# Patient Record
Sex: Female | Born: 1965 | Race: White | Hispanic: No | Marital: Single | Smoking: Never smoker
Health system: Southern US, Community
[De-identification: ages and names within clinical notes are randomized; demographics above are authoritative.]

## PROBLEM LIST (undated history)

## (undated) DIAGNOSIS — K219 Gastro-esophageal reflux disease without esophagitis: Secondary | ICD-10-CM

## (undated) DIAGNOSIS — I471 Supraventricular tachycardia, unspecified: Secondary | ICD-10-CM

## (undated) DIAGNOSIS — E669 Obesity, unspecified: Secondary | ICD-10-CM

## (undated) DIAGNOSIS — M545 Low back pain, unspecified: Secondary | ICD-10-CM

## (undated) DIAGNOSIS — F329 Major depressive disorder, single episode, unspecified: Secondary | ICD-10-CM

## (undated) DIAGNOSIS — I10 Essential (primary) hypertension: Secondary | ICD-10-CM

## (undated) DIAGNOSIS — F32A Depression, unspecified: Secondary | ICD-10-CM

## (undated) DIAGNOSIS — M1990 Unspecified osteoarthritis, unspecified site: Secondary | ICD-10-CM

## (undated) DIAGNOSIS — G8929 Other chronic pain: Secondary | ICD-10-CM

## (undated) HISTORY — PX: HEMANGIOMA EXCISION: SHX1734

## (undated) HISTORY — PX: TOE SURGERY: SHX1073

---

## 1988-06-24 HISTORY — PX: REDUCTION MAMMAPLASTY: SUR839

## 2004-07-18 ENCOUNTER — Encounter: Admission: RE | Admit: 2004-07-18 | Discharge: 2004-07-18 | Payer: Self-pay | Admitting: Family Medicine

## 2010-07-15 ENCOUNTER — Encounter: Payer: Self-pay | Admitting: Family Medicine

## 2011-08-28 ENCOUNTER — Encounter (HOSPITAL_BASED_OUTPATIENT_CLINIC_OR_DEPARTMENT_OTHER): Payer: Self-pay | Admitting: Emergency Medicine

## 2011-08-28 ENCOUNTER — Emergency Department (HOSPITAL_BASED_OUTPATIENT_CLINIC_OR_DEPARTMENT_OTHER)
Admission: EM | Admit: 2011-08-28 | Discharge: 2011-08-28 | Disposition: A | Discharge status: Home Health | Payer: Self-pay | Attending: Emergency Medicine | Admitting: Emergency Medicine

## 2011-08-28 ENCOUNTER — Other Ambulatory Visit: Payer: Self-pay

## 2011-08-28 DIAGNOSIS — E669 Obesity, unspecified: Secondary | ICD-10-CM | POA: Insufficient documentation

## 2011-08-28 DIAGNOSIS — I498 Other specified cardiac arrhythmias: Secondary | ICD-10-CM | POA: Insufficient documentation

## 2011-08-28 DIAGNOSIS — R002 Palpitations: Secondary | ICD-10-CM | POA: Insufficient documentation

## 2011-08-28 DIAGNOSIS — I471 Supraventricular tachycardia: Secondary | ICD-10-CM

## 2011-08-28 HISTORY — DX: Major depressive disorder, single episode, unspecified: F32.9

## 2011-08-28 HISTORY — DX: Supraventricular tachycardia, unspecified: I47.10

## 2011-08-28 HISTORY — DX: Depression, unspecified: F32.A

## 2011-08-28 HISTORY — DX: Supraventricular tachycardia: I47.1

## 2011-08-28 HISTORY — DX: Obesity, unspecified: E66.9

## 2011-08-28 LAB — BASIC METABOLIC PANEL
CO2: 25 mEq/L (ref 19–32)
Calcium: 9.1 mg/dL (ref 8.4–10.5)
Creatinine, Ser: 0.8 mg/dL (ref 0.50–1.10)
GFR calc non Af Amer: 89 mL/min — ABNORMAL LOW (ref >90)

## 2011-08-28 LAB — MAGNESIUM: Magnesium: 2.3 mg/dL (ref 1.5–2.5)

## 2011-08-28 MED ORDER — VERAPAMIL HCL ER 120 MG PO TBCR: 120.0000 mg | EXTENDED_RELEASE_TABLET | Freq: Every day | ORAL | Status: AC

## 2011-08-28 MED ORDER — POTASSIUM CHLORIDE 20 MEQ/15ML (10%) PO LIQD
20.0000 meq | Freq: Once | ORAL | Status: AC
  Administered 2011-08-28: 20 meq via ORAL
  Filled 2011-08-28: qty 15

## 2011-08-28 MED ORDER — ADENOSINE 6 MG/2ML IV SOLN
INTRAVENOUS | Status: AC
  Filled 2011-08-28: qty 6

## 2011-08-28 MED ORDER — ADENOSINE 6 MG/2ML IV SOLN
6.0000 mg | Freq: Once | INTRAVENOUS | Status: AC
  Administered 2011-08-28: 6 mg via INTRAVENOUS

## 2011-08-28 NOTE — ED Notes (Signed)
Pt c/o rapid heart rate with headache and tightness in shoulders. Pt has hx of svt.

## 2011-08-28 NOTE — ED Notes (Signed)
Pt ambulatory to restroom without difficulty.

## 2011-08-28 NOTE — ED Provider Notes (Signed)
History     CSN: [id number]  Arrival date & time 08/28/11  2105   First MD Initiated Contact with Patient 08/28/11 2127      Chief Complaint  Patient presents with  . Tachycardia    (Consider location/radiation/quality/duration/timing/severity/associated sxs/prior treatment) HPI  H/o SVT pw patient. The patient states that about an hour prior to arrival she began to experience sudden onset palpitations. Similar to her previous SVT. She tried vagal maneuvers at home without success. The patient complains of tightness in her shoulders and her back similar to her previous SVT. She does not have a cardiologist at this time. She denies chest pain, shortness of breath. Denies h/o VTE in self or family. No recent hosp/surg/immob. No h/o cancer. Denies exogenous hormone use, no leg pain or swelling. sHe states she's been more stressed recently which sometimes aggravates her SVT. She has not been compliant with her verapamil.  ED Notes, ED Provider Notes from 08/28/11 0000 to 08/28/11 21:17:40       Caryl Asp Dulcy Fanny, RN 08/28/2011 21:15      Pt c/o rapid heart rate with headache and tightness in shoulders. Pt has hx of svt.       Past Medical History  Diagnosis Date  . SVT (supraventricular tachycardia)   . Depression   . Obesity   . Migraine     Past Surgical History  Procedure Date  . Angioma cautery   . Toe surgery     No family history on file.  History  Substance Use Topics  . Smoking status: Never Smoker   . Smokeless tobacco: Not on file  . Alcohol Use: No    OB History    Grav Para Term Preterm Abortions TAB SAB Ect Mult Living                  Review of Systems  All other systems reviewed and are negative.   except as noted HPI   Allergies  Penicillins  Home Medications   Current Outpatient Rx  Name Route Sig Dispense Refill  . VERAPAMIL HCL ER 120 MG PO TBCR Oral Take 1 tablet (120 mg total) by mouth at bedtime. 30 tablet 0    BP 111/64  Temp(Src)  98.3 F (36.8 C) (Oral)  Resp 18  SpO2 100%  Physical Exam  Nursing note and vitals reviewed. Constitutional: She is oriented to person, place, and time. She appears well-developed.  HENT:  Head: Atraumatic.  Mouth/Throat: Oropharynx is clear and moist.  Eyes: Conjunctivae and EOM are normal. Pupils are equal, round, and reactive to light.  Neck: Normal range of motion. Neck supple.  Cardiovascular: Regular rhythm, normal heart sounds and intact distal pulses.        tachycardic  Pulmonary/Chest: Effort normal and breath sounds normal. No respiratory distress. She has no wheezes. She has no rales.  Abdominal: Soft. She exhibits no distension. There is no tenderness. There is no rebound and no guarding.  Musculoskeletal: Normal range of motion.  Neurological: She is alert and oriented to person, place, and time.  Skin: Skin is warm and dry. No rash noted. There is pallor.  Psychiatric: She has a normal mood and affect.       Appears anxious    Date: 08/28/2011   2116  Rate: 170  Rhythm: supraventricular tachycardia (SVT)  QRS Axis: normal  Intervals: normal  ST/T Wave abnormalities: nonspecific ST changes  Conduction Disutrbances:none  Narrative Interpretation:   Old EKG Reviewed: none available  Date: 08/28/2011  2128  Rate: 97  Rhythm: normal sinus rhythm  QRS Axis: normal  Intervals: normal  ST/T Wave abnormalities: normal  Conduction Disutrbances:none  Narrative Interpretation:   Old EKG Reviewed: changes noted   ED Course  CARDIOVERSION, chemical Performed by: Forbes Cellar Authorized by: Forbes Cellar Consent: Verbal consent obtained. Written consent not obtained. The procedure was performed in an emergent situation. Risks and benefits: risks, benefits and alternatives were discussed Consent given by: patient Patient understanding: patient states understanding of the procedure being performed Patient consent: the patient's understanding of the procedure  matches consent given Procedure consent: procedure consent matches procedure scheduled Patient identity confirmed: arm band Time out: Immediately prior to procedure a "time out" was called to verify the correct patient, procedure, equipment, support staff and site/side marked as required. Patient sedated: no Cardioversion basis: emergent Pre-procedure rhythm: supraventricular tachycardia Patient position: patient was placed in a supine position Chest area: chest area exposed Electrodes: pads Electrodes placed: anterior-posterior Number of attempts: 1 Attempt 1 outcome: conversion to normal sinus rhythm Post-procedure rhythm: normal sinus rhythm Patient tolerance: Patient tolerated the procedure well with no immediate complications. Comments: Adenosine 6mg  given   (including critical care time)  Labs Reviewed  BASIC METABOLIC PANEL - Abnormal; Notable for the following:    Potassium 3.4 (*)    Glucose, Bld 111 (*)    GFR calc non Af Amer 89 (*)    All other components within normal limits  MAGNESIUM  PHOSPHORUS   No results found.   1. SVT (supraventricular tachycardia)     MDM  SVT with history of the same. Noncompliant with verapamil. Patient given 6 mg of adenosine with termination of the rhythm. She is currently in sinus rhythm and heart rate has been under 100. Electrolytes are within normal limits except for mildly decreased potassium. This has been supplemented. She will be discharged home with a cardiology referral. Precautions for return.         Forbes Cellar, MD 08/28/11 2226

## 2011-08-28 NOTE — Discharge Instructions (Signed)
Supraventricular Tachycardia  Supraventricular tachycardia (SVT) is an abnormal heart rhythm (arrhythmia) that causes the heart to beat very fast (tachycardia). This kind of fast heartbeat originates in the upper chambers of the heart (atria). SVT can cause the heart to beat greater than 100 beats per minute. SVT can have a rapid burst of heartbeats. This can start and stop suddenly without warning and is called nonsustained. SVT can also be sustained, in which the heart beats at a continuous fast rate.   CAUSES   There can be different causes of SVT. Some of these include:   Heart valve problems such as mitral valve prolapse.   An enlarged heart (hypertrophic cardiomyopathy).   Congenital heart problems.   Heart inflammation (pericarditis).   Hyperthyroidism.   Low potassium or magnesium levels.   Caffeine.   Drug use such as cocaine, methamphetamines, or stimulants.   Some over-the-counter medicines such as:   Decongestants.   Diet medicines.   Herbal medicines.  SYMPTOMS   Symptoms of SVT can vary. Symptoms depend on whether the SVT is sustained or nonsustained. You may experience:   No symptoms (asymptomatic).   An awareness of your heart beating rapidly (palpitations).   Shortness of breath.   Chest pain or pressure.  If your blood pressure drops because of the SVT, you may experience:   Fainting or near fainting.   Weakness.   Dizziness.  DIAGNOSIS   Different tests can be performed to diagnose SVT, such as:   An electrocardiogram (EKG). This is a painless test that records the electrical activity of your heart.   Holter monitor. This is a 24 hour recording of your heart rhythm. You will be given a diary. Write down all symptoms that you have and what you were doing at the time you experienced symptoms.   Arrhythmia monitor. This is a small device that your wear for several weeks. It records the heart rhythm when you have symptoms.   Echocardiogram. This is an imaging test to help detect  abnormal heart structure such as congenital abnormalities, heart valve problems, or heart enlargement.   Stress test. This test can help determine if the SVT is related to exercise.   Electrophysiology study (EPS). This is a procedure that evaluates your heart's electrical system and can help your caregiver find the cause of your SVT.  TREATMENT   Treatment of SVT depends on the symptoms, how often it recurs, and whether there are any underlying heart problems.    If symptoms are rare and no other cardiac disease is present, no treatment may be needed.   Blood work may be done to check potassium, magnesium, and thyroid hormone levels to see if they are abnormal. If these levels are abnormal, treatment to correct the problems will occur.  Medicines  Your caregiver may use oral medicines to treat SVT. These medicines are given for long-term control of SVT. Medicines may be used alone or in combination with other treatments. These medicines work to slow nerve impulses in the heart muscle. These medicines can also be used to treat high blood pressure. Some of these medicines may include:   Calcium channel blockers.   Beta blockers.   Digoxin.  Nonsurgical procedures  Nonsurgical techniques may be used if oral medicines do not work. Some examples include:   Cardioversion. This technique uses either drugs or an electrical shock to restore a normal heart rhythm.   Cardioversion drugs may be given through an intravenous (IV) line to help "reset" the   heart rhythm.   In electrical cardioversion, the caregiver shocks your heart to stop its beat for a split second. This helps to reset the heart to a normal rhythm.   Ablation. This procedure is done under mild sedation. High frequency radio wave energy is used to destroy the area of heart tissue responsible for the SVT.  HOME CARE INSTRUCTIONS    Do not smoke.   Only take medicines prescribed by your caregiver. Check with your caregiver before using over-the-counter  medicines.   Check with your caregiver about how much alcohol and caffeine (coffee, tea, colas, or chocolate) you may have.   It is very important to keep all follow-up referrals and appointments in order to properly manage this problem.  SEEK IMMEDIATE MEDICAL CARE IF:   You have dizziness.   You faint or nearly faint.   You have shortness of breath.   You have chest pain or pressure.   You have sudden nausea or vomiting.   You have profuse sweating.   You are concerned about how long your symptoms last.   You are concerned about the frequency of your SVT episodes.  If you have the above symptoms, call your local emergency services (911 in [place] immediately. Do not drive yourself to the hospital.  MAKE SURE YOU:    Understand these instructions.   Will watch your condition.   Will get help right away if you are not doing well or get worse.  Document Released: 06/10/2005 Document Revised: 05/30/2011 Document Reviewed: 09/22/2008  [employer] Patient Information 2012 [employer]

## 2012-10-31 ENCOUNTER — Emergency Department (HOSPITAL_BASED_OUTPATIENT_CLINIC_OR_DEPARTMENT_OTHER)
Admission: EM | Admit: 2012-10-31 | Discharge: 2012-10-31 | Disposition: A | Discharge status: Home / Self Care | Payer: Self-pay | Attending: Emergency Medicine | Admitting: Emergency Medicine

## 2012-10-31 ENCOUNTER — Encounter (HOSPITAL_BASED_OUTPATIENT_CLINIC_OR_DEPARTMENT_OTHER): Payer: Self-pay | Admitting: *Deleted

## 2012-10-31 DIAGNOSIS — Z8679 Personal history of other diseases of the circulatory system: Secondary | ICD-10-CM | POA: Insufficient documentation

## 2012-10-31 DIAGNOSIS — Z880 Allergy status to penicillin: Secondary | ICD-10-CM | POA: Insufficient documentation

## 2012-10-31 DIAGNOSIS — F3289 Other specified depressive episodes: Secondary | ICD-10-CM | POA: Insufficient documentation

## 2012-10-31 DIAGNOSIS — Z79899 Other long term (current) drug therapy: Secondary | ICD-10-CM | POA: Insufficient documentation

## 2012-10-31 DIAGNOSIS — F329 Major depressive disorder, single episode, unspecified: Secondary | ICD-10-CM | POA: Insufficient documentation

## 2012-10-31 DIAGNOSIS — G43909 Migraine, unspecified, not intractable, without status migrainosus: Secondary | ICD-10-CM | POA: Insufficient documentation

## 2012-10-31 DIAGNOSIS — E669 Obesity, unspecified: Secondary | ICD-10-CM | POA: Insufficient documentation

## 2012-10-31 MED ORDER — METOCLOPRAMIDE HCL 5 MG/ML IJ SOLN
10.0000 mg | Freq: Once | INTRAMUSCULAR | Status: AC
  Administered 2012-10-31: 10 mg via INTRAMUSCULAR
  Filled 2012-10-31: qty 2

## 2012-10-31 MED ORDER — ZOLMITRIPTAN 5 MG PO TABS: 5.0000 mg | ORAL_TABLET | ORAL | Status: DC | PRN

## 2012-10-31 MED ORDER — KETOROLAC TROMETHAMINE 60 MG/2ML IM SOLN
60.0000 mg | Freq: Once | INTRAMUSCULAR | Status: AC
  Administered 2012-10-31: 60 mg via INTRAMUSCULAR
  Filled 2012-10-31: qty 2

## 2012-10-31 MED ORDER — DIPHENHYDRAMINE HCL 50 MG/ML IJ SOLN
25.0000 mg | Freq: Once | INTRAMUSCULAR | Status: AC
  Administered 2012-10-31: 25 mg via INTRAMUSCULAR
  Filled 2012-10-31: qty 1

## 2012-10-31 NOTE — ED Notes (Signed)
Pt has hx of migraines and has run out of her meds. Normally takes Zomig, but can no longer get Rx for same because she does not have a Dr.  Marland KitchenNo insurance."

## 2012-10-31 NOTE — ED Notes (Signed)
Rx x 1 for zomig given- pt has a ride

## 2012-10-31 NOTE — ED Provider Notes (Signed)
History     CSN: [id number]  Arrival date & time 10/31/12  1524   First MD Initiated Contact with Patient 10/31/12 1620      Chief Complaint  Patient presents with  . Migraine    (Consider location/radiation/quality/duration/timing/severity/associated sxs/prior treatment) Patient is a 47 y.o. female presenting with migraines. The history is provided by the patient. No language interpreter was used.  Migraine This is a new problem. The current episode started yesterday. The problem occurs constantly. The problem has been gradually worsening. Nothing aggravates the symptoms. She has tried nothing for the symptoms. The treatment provided moderate relief.  Pt complains of a migrane headache.    Past Medical History  Diagnosis Date  . SVT (supraventricular tachycardia)   . Depression   . Obesity   . Migraine     Past Surgical History  Procedure Laterality Date  . Angioma cautery    . Toe surgery      No family history on file.  History  Substance Use Topics  . Smoking status: Never Smoker   . Smokeless tobacco: Not on file  . Alcohol Use: No    OB History   Grav Para Term Preterm Abortions TAB SAB Ect Mult Living                  Review of Systems  All other systems reviewed and are negative.    Allergies  Penicillins  Home Medications   Current Outpatient Rx  Name  Route  Sig  Dispense  Refill  . cloNIDine (CATAPRES) 0.1 MG tablet   Oral   Take 0.1 mg by mouth at bedtime.         Marland Kitchen acetaminophen (TYLENOL) 500 MG tablet   Oral   Take 1,000 mg by mouth every 6 (six) hours as needed. Patient used this medication for pain.         . DULoxetine (CYMBALTA) 60 MG capsule   Oral   Take 60 mg by mouth daily.         . fluticasone (FLONASE) 50 MCG/ACT nasal spray   Nasal   Place 2 sprays into the nose daily.         Marland Kitchen ibuprofen (ADVIL,MOTRIN) 200 MG tablet   Oral   Take 600 mg by mouth every 6 (six) hours as needed. Patient used this medication  for pain.         Marland Kitchen lamoTRIgine (LAMICTAL) 200 MG tablet   Oral   Take 200 mg by mouth daily.         Marland Kitchen trolamine salicylate (ASPERCREME) 10 % cream   Topical   Apply topically as needed. Patient used this medication for foot and shoulder pain.         Marland Kitchen zolmitriptan (ZOMIG) 5 MG tablet   Oral   Take 5 mg by mouth as needed. Patient took 2.5 mg of this medication today.           BP 138/81  Pulse 63  Temp(Src) 97.8 F (36.6 C) (Oral)  Resp 18  Ht 5\' 5"  (1.651 m)  Wt 250 lb (113.399 kg)  BMI 41.6 kg/m2  SpO2 100%  LMP 10/31/2012  Physical Exam  Nursing note and vitals reviewed. Constitutional: She appears well-developed and well-nourished.  HENT:  Head: Normocephalic and atraumatic.  Right Ear: External ear normal.  Left Ear: External ear normal.  Nose: Nose normal.  Mouth/Throat: Oropharynx is clear and moist.  Eyes: Pupils are equal, round, and reactive to  light.  Neck: Normal range of motion. Neck supple.  Cardiovascular: Normal rate.   Pulmonary/Chest: Effort normal.  Abdominal: Soft.  Musculoskeletal: Normal range of motion.  Neurological: She is alert.  Skin: Skin is warm.    ED Course  Procedures (including critical care time)  Labs Reviewed - No data to display No results found.   No diagnosis found.    MDM  Pt is given reglan and benadryl and torodol.          Elson Areas, PA-C 10/31/12 1724  Medical screening examination/treatment/procedure(s) were performed by non-physician practitioner and as supervising physician I was immediately available for consultation/collaboration.  Derwood Kaplan, MD 11/03/12 [phone]

## 2013-08-29 ENCOUNTER — Other Ambulatory Visit: Payer: Self-pay

## 2013-08-29 ENCOUNTER — Emergency Department (HOSPITAL_BASED_OUTPATIENT_CLINIC_OR_DEPARTMENT_OTHER): Payer: BC Managed Care – PPO

## 2013-08-29 ENCOUNTER — Encounter (HOSPITAL_BASED_OUTPATIENT_CLINIC_OR_DEPARTMENT_OTHER): Payer: Self-pay | Admitting: Emergency Medicine

## 2013-08-29 ENCOUNTER — Emergency Department (HOSPITAL_BASED_OUTPATIENT_CLINIC_OR_DEPARTMENT_OTHER)
Admission: EM | Admit: 2013-08-29 | Discharge: 2013-08-30 | Disposition: A | Discharge status: Home / Self Care | Payer: BC Managed Care – PPO | Attending: Emergency Medicine | Admitting: Emergency Medicine

## 2013-08-29 DIAGNOSIS — R0602 Shortness of breath: Secondary | ICD-10-CM | POA: Insufficient documentation

## 2013-08-29 DIAGNOSIS — I471 Supraventricular tachycardia: Secondary | ICD-10-CM

## 2013-08-29 DIAGNOSIS — E669 Obesity, unspecified: Secondary | ICD-10-CM | POA: Insufficient documentation

## 2013-08-29 DIAGNOSIS — F3289 Other specified depressive episodes: Secondary | ICD-10-CM | POA: Insufficient documentation

## 2013-08-29 DIAGNOSIS — Z79899 Other long term (current) drug therapy: Secondary | ICD-10-CM | POA: Insufficient documentation

## 2013-08-29 DIAGNOSIS — Z880 Allergy status to penicillin: Secondary | ICD-10-CM | POA: Insufficient documentation

## 2013-08-29 DIAGNOSIS — F329 Major depressive disorder, single episode, unspecified: Secondary | ICD-10-CM | POA: Insufficient documentation

## 2013-08-29 DIAGNOSIS — IMO0002 Reserved for concepts with insufficient information to code with codable children: Secondary | ICD-10-CM | POA: Insufficient documentation

## 2013-08-29 DIAGNOSIS — G43909 Migraine, unspecified, not intractable, without status migrainosus: Secondary | ICD-10-CM | POA: Insufficient documentation

## 2013-08-29 DIAGNOSIS — I498 Other specified cardiac arrhythmias: Secondary | ICD-10-CM | POA: Insufficient documentation

## 2013-08-29 LAB — BASIC METABOLIC PANEL
BUN: 12 mg/dL (ref 6–23)
CALCIUM: 9.5 mg/dL (ref 8.4–10.5)
CHLORIDE: 102 meq/L (ref 96–112)
CO2: 23 meq/L (ref 19–32)
Creatinine, Ser: 0.7 mg/dL (ref 0.50–1.10)
GFR calc Af Amer: >90 mL/min (ref >90)
GFR calc non Af Amer: >90 mL/min (ref >90)
GLUCOSE: 143 mg/dL — AB (ref 70–99)
POTASSIUM: 4.2 meq/L (ref 3.7–5.3)
SODIUM: 141 meq/L (ref 137–147)

## 2013-08-29 LAB — CBC WITH DIFFERENTIAL/PLATELET
BASOS ABS: 0.1 10*3/uL (ref 0.0–0.1)
Basophils Relative: 1 % (ref 0–1)
EOS PCT: 9 % — AB (ref 0–5)
Eosinophils Absolute: 0.9 10*3/uL — ABNORMAL HIGH (ref 0.0–0.7)
HEMATOCRIT: 43.5 % (ref 36.0–46.0)
Hemoglobin: 14.7 g/dL (ref 12.0–15.0)
LYMPHS ABS: 2.6 10*3/uL (ref 0.7–4.0)
LYMPHS PCT: 24 % (ref 12–46)
MCH: 30.5 pg (ref 26.0–34.0)
MCHC: 33.8 g/dL (ref 30.0–36.0)
MCV: 90.2 fL (ref 78.0–100.0)
Monocytes Absolute: 0.6 10*3/uL (ref 0.1–1.0)
Monocytes Relative: 6 % (ref 3–12)
NEUTROS ABS: 6.6 10*3/uL (ref 1.7–7.7)
Neutrophils Relative %: 61 % (ref 43–77)
PLATELETS: 385 10*3/uL (ref 150–400)
RBC: 4.82 MIL/uL (ref 3.87–5.11)
RDW: 13.8 % (ref 11.5–15.5)
WBC: 10.8 10*3/uL — AB (ref 4.0–10.5)

## 2013-08-29 MED ORDER — SODIUM CHLORIDE 0.9 % IV SOLN
INTRAVENOUS | Status: DC
  Administered 2013-08-29: 23:00:00 via INTRAVENOUS

## 2013-08-29 MED ORDER — ADENOSINE 6 MG/2ML IV SOLN
INTRAVENOUS | Status: AC
Start: 2013-08-29 — End: 2013-08-29
  Administered 2013-08-29: 6 mg via INTRAVENOUS
  Filled 2013-08-29: qty 2

## 2013-08-29 NOTE — ED Notes (Signed)
Patient has had a rapid HR for the past hour. 194 in triage. Has had this in the past and been treated for it multiple times

## 2013-08-29 NOTE — ED Provider Notes (Addendum)
CSN: [id number]     Arrival date & time 08/29/13  2235 History   This chart was scribed for Jennifer Jakes, MD by Manuela Schwartz, ED scribe. This patient was seen in room MH07/MH07 and the patient's care was started at 2235.  Chief Complaint  Patient presents with  . Tachycardia   Patient is a 48 y.o. female presenting with palpitations. The history is provided by the patient. No language interpreter was used.  Palpitations Palpitations quality:  Fast Onset quality:  Sudden Duration:  1 hour Timing:  Constant Progression:  Unchanged Chronicity:  Recurrent Relieved by:  Nothing Worsened by:  Nothing tried Ineffective treatments:  Valsalva Associated symptoms: chest pain and shortness of breath   Associated symptoms: no back pain, no cough, no nausea and no vomiting    HPI Comments: Jennifer Howard is a 48 y.o. female who presents currently in SVT to the Emergency Department with h/o recurrent SVT last episode a few weeks ago, sometimes gets relief w/vagal maneuvers at home which did not work today, onset x1 hour PTA. She usually converts well w/adenosine when this happens. She reports mild SOB, nausea and chest tightness/discomfort. She denies any skin rash, visual changes, dysuria, back pain, hemophilia, cough/cold, sore throat or fever/chills.   No hx of dm, htn.  Past Medical History  Diagnosis Date  . SVT (supraventricular tachycardia)   . Depression   . Obesity   . Migraine    Past Surgical History  Procedure Laterality Date  . Angioma cautery    . Toe surgery     No family history on file. History  Substance Use Topics  . Smoking status: Never Smoker   . Smokeless tobacco: Not on file  . Alcohol Use: No   OB History   Grav Para Term Preterm Abortions TAB SAB Ect Mult Living                 Review of Systems  Constitutional: Negative for chills and fatigue.  HENT: Negative for rhinorrhea and sore throat.   Eyes: Negative for visual disturbance.  Respiratory:  Positive for shortness of breath. Negative for cough.   Cardiovascular: Positive for chest pain and palpitations. Negative for leg swelling.  Gastrointestinal: Negative for nausea, vomiting, abdominal pain and diarrhea.  Musculoskeletal: Negative for back pain.  Skin: Negative for rash.  Hematological: Does not bruise/bleed easily.  Psychiatric/Behavioral: Negative for confusion.  All other systems reviewed and are negative.    Allergies  Iodine and Penicillins  Home Medications   Current Outpatient Rx  Name  Route  Sig  Dispense  Refill  . acetaminophen (TYLENOL) 500 MG tablet   Oral   Take 1,000 mg by mouth every 6 (six) hours as needed. Patient used this medication for pain.         . cloNIDine (CATAPRES) 0.1 MG tablet   Oral   Take 0.1 mg by mouth at bedtime.         . DULoxetine (CYMBALTA) 60 MG capsule   Oral   Take 60 mg by mouth daily.         . fluticasone (FLONASE) 50 MCG/ACT nasal spray   Nasal   Place 2 sprays into the nose daily.         Marland Kitchen ibuprofen (ADVIL,MOTRIN) 200 MG tablet   Oral   Take 600 mg by mouth every 6 (six) hours as needed. Patient used this medication for pain.         Marland Kitchen lamoTRIgine (  LAMICTAL) 200 MG tablet   Oral   Take 200 mg by mouth daily.         Marland Kitchen trolamine salicylate (ASPERCREME) 10 % cream   Topical   Apply topically as needed. Patient used this medication for foot and shoulder pain.         Marland Kitchen zolmitriptan (ZOMIG) 5 MG tablet   Oral   Take 5 mg by mouth as needed. Patient took 2.5 mg of this medication today.         . zolmitriptan (ZOMIG) 5 MG tablet   Oral   Take 1 tablet (5 mg total) by mouth as needed for migraine.   10 tablet   0    Pulse 186  SpO2 100%  Physical Exam  Nursing note and vitals reviewed. Constitutional: She is oriented to person, place, and time. She appears well-developed and well-nourished. No distress.  HENT:  Head: Normocephalic and atraumatic.  Eyes: Conjunctivae are normal.  Right eye exhibits no discharge. Left eye exhibits no discharge.  Neck: Normal range of motion.  Cardiovascular: Normal heart sounds.   No murmur heard. tachycardic  Pulmonary/Chest: Effort normal and breath sounds normal. No respiratory distress.  Abdominal: Soft. Bowel sounds are normal. She exhibits no distension. There is no tenderness.  Musculoskeletal: Normal range of motion. She exhibits no edema.  No ankle swelling  Neurological: She is alert and oriented to person, place, and time.  Neuro grossly intact  Skin: Skin is warm and dry.  Psychiatric: She has a normal mood and affect. Thought content normal.   ED Course  Procedures (including critical care time) DIAGNOSTIC STUDIES: Oxygen Saturation is 100% on room air, normal by my interpretation.    COORDINATION OF CARE: At 1107 Discussed treatment plan with patient which includes peripheral IV adenosine . Patient agrees.   Labs Review Labs Reviewed  CBC WITH DIFFERENTIAL - Abnormal; Notable for the following:    WBC 10.8 (*)    Eosinophils Relative 9 (*)    Eosinophils Absolute 0.9 (*)    All other components within normal limits  BASIC METABOLIC PANEL   Results for orders placed during the hospital encounter of 08/29/13  CBC WITH DIFFERENTIAL      Result Value Ref Range   WBC 10.8 (*) 4.0 - 10.5 K/uL   RBC 4.82  3.87 - 5.11 MIL/uL   Hemoglobin 14.7  12.0 - 15.0 g/dL   HCT 16.1  09.6 - 04.5 %   MCV 90.2  78.0 - 100.0 fL   MCH 30.5  26.0 - 34.0 pg   MCHC 33.8  30.0 - 36.0 g/dL   RDW 40.9  81.1 - 91.4 %   Platelets 385  150 - 400 K/uL   Neutrophils Relative % 61  43 - 77 %   Neutro Abs 6.6  1.7 - 7.7 K/uL   Lymphocytes Relative 24  12 - 46 %   Lymphs Abs 2.6  0.7 - 4.0 K/uL   Monocytes Relative 6  3 - 12 %   Monocytes Absolute 0.6  0.1 - 1.0 K/uL   Eosinophils Relative 9 (*) 0 - 5 %   Eosinophils Absolute 0.9 (*) 0.0 - 0.7 K/uL   Basophils Relative 1  0 - 1 %   Basophils Absolute 0.1  0.0 - 0.1 K/uL  BASIC  METABOLIC PANEL      Result Value Ref Range   Sodium 141  137 - 147 mEq/L   Potassium 4.2  3.7 - 5.3 mEq/L  Chloride 102  96 - 112 mEq/L   CO2 23  19 - 32 mEq/L   Glucose, Bld 143 (*) 70 - 99 mg/dL   BUN 12  6 - 23 mg/dL   Creatinine, Ser 1.610.70  0.50 - 1.10 mg/dL   Calcium 9.5  8.4 - 09.610.5 mg/dL   GFR calc non Af Amer >90  >90 mL/min   GFR calc Af Amer >90  >90 mL/min    Imaging Review   Results for orders placed during the hospital encounter of 08/29/13  CBC WITH DIFFERENTIAL      Result Value Ref Range   WBC 10.8 (*) 4.0 - 10.5 K/uL   RBC 4.82  3.87 - 5.11 MIL/uL   Hemoglobin 14.7  12.0 - 15.0 g/dL   HCT 04.543.5  40.936.0 - 81.146.0 %   MCV 90.2  78.0 - 100.0 fL   MCH 30.5  26.0 - 34.0 pg   MCHC 33.8  30.0 - 36.0 g/dL   RDW 91.413.8  78.211.5 - 95.615.5 %   Platelets 385  150 - 400 K/uL   Neutrophils Relative % 61  43 - 77 %   Neutro Abs 6.6  1.7 - 7.7 K/uL   Lymphocytes Relative 24  12 - 46 %   Lymphs Abs 2.6  0.7 - 4.0 K/uL   Monocytes Relative 6  3 - 12 %   Monocytes Absolute 0.6  0.1 - 1.0 K/uL   Eosinophils Relative 9 (*) 0 - 5 %   Eosinophils Absolute 0.9 (*) 0.0 - 0.7 K/uL   Basophils Relative 1  0 - 1 %   Basophils Absolute 0.1  0.0 - 0.1 K/uL  BASIC METABOLIC PANEL      Result Value Ref Range   Sodium 141  137 - 147 mEq/L   Potassium 4.2  3.7 - 5.3 mEq/L   Chloride 102  96 - 112 mEq/L   CO2 23  19 - 32 mEq/L   Glucose, Bld 143 (*) 70 - 99 mg/dL   BUN 12  6 - 23 mg/dL   Creatinine, Ser 2.130.70  0.50 - 1.10 mg/dL   Calcium 9.5  8.4 - 08.610.5 mg/dL   GFR calc non Af Amer >90  >90 mL/min   GFR calc Af Amer >90  >90 mL/min   Dg Chest Port 1 View  08/29/2013   CLINICAL DATA:  SVTs, mild shortness of breath.  EXAM: PORTABLE CHEST - 1 VIEW  COMPARISON:  None.  FINDINGS: Limited evaluation due to technique/patient body habitus. Cardiomediastinal contours within normal range. No confluent airspace opacity, pleural effusion, or pneumothorax. No definite acute osseous finding.  IMPRESSION:  Within limitations of technique, no acute process identified.   Electronically Signed   By: Jearld LeschAndrew  DelGaizo M.D.   On: 08/29/2013 23:36     No results found.   EKG Interpretation None      Date: 08/29/2013  Rate: 180  Rhythm: supraventricular tachycardia (SVT)  QRS Axis: normal  Intervals: normal  ST/T Wave abnormalities: nonspecific ST changes  Conduction Disutrbances:none  Narrative Interpretation:   Old EKG Reviewed: none available  Status post adenosine 6 mg IV push patient converted second EKG as follows   Date: 08/29/2013  Rate: 96  Rhythm: normal sinus rhythm  QRS Axis: normal  Intervals: normal  ST/T Wave abnormalities: normal  Conduction Disutrbances:none  Narrative Interpretation:   Old EKG Reviewed: changes noted  CRITICAL CARE Performed by: Jennifer JakesZACKOWSKI,Krystalynn Ridgeway W. Total critical care time: 30 Critical care time was  exclusive of separately billable procedures and treating other patients. Critical care was necessary to treat or prevent imminent or life-threatening deterioration. Critical care was time spent personally by me on the following activities: development of treatment plan with patient and/or surrogate as well as nursing, discussions with consultants, evaluation of patient's response to treatment, examination of patient, obtaining history from patient or surrogate, ordering and performing treatments and interventions, ordering and review of laboratory studies, ordering and review of radiographic studies, pulse oximetry and re-evaluation of patient's condition.     MDM   Final diagnoses:  SVT (supraventricular tachycardia)    Patient new to the area does not have a local cardiologist. Just got a primary care doctor recently at [facility]. Patient with known history of SVT. Last episode was 2 weeks ago happens quite frequently patient normally able to converted with Valsalva maneuvers on her own. This time it did not convert. Patient arrived here they'll  status was normal rapid heart rate of 180 consistent with SVT minor got chest discomfort. Patient received IV 6 mg of adenosine with conversion. Heart rate back to normal sinus rhythm. Patient's basic labs and chest x-ray pending if normal can be discharged home. Will give referral to [facility] however patient will probably prefer to use cortisone she will contact her primary care Dr. for referral. Patient has been recommended to have ablation in the past but has refused.    I personally performed the services described in this documentation, which was scribed in my presence. The recorded information has been reviewed and is accurate.      Jennifer Jakes, MD 08/29/13 1610  Jennifer Jakes, MD 08/29/13 [phone]

## 2013-08-29 NOTE — Discharge Instructions (Signed)
Followup with cardiology. Return for any recurrent SVTs. Given given referral to [facility] but if you want to contact her new primary care Dr. at [facility] and [place] that fine.

## 2013-08-30 NOTE — ED Notes (Signed)
Ice pack applied to right hand d/t edema pt denies pain until touched right hand elevated

## 2013-09-26 ENCOUNTER — Emergency Department (HOSPITAL_BASED_OUTPATIENT_CLINIC_OR_DEPARTMENT_OTHER)
Admission: EM | Admit: 2013-09-26 | Discharge: 2013-09-26 | Disposition: A | Discharge status: Home / Self Care | Payer: BC Managed Care – PPO | Attending: Emergency Medicine | Admitting: Emergency Medicine

## 2013-09-26 ENCOUNTER — Encounter (HOSPITAL_BASED_OUTPATIENT_CLINIC_OR_DEPARTMENT_OTHER): Payer: Self-pay | Admitting: Emergency Medicine

## 2013-09-26 DIAGNOSIS — E669 Obesity, unspecified: Secondary | ICD-10-CM | POA: Insufficient documentation

## 2013-09-26 DIAGNOSIS — IMO0002 Reserved for concepts with insufficient information to code with codable children: Secondary | ICD-10-CM | POA: Insufficient documentation

## 2013-09-26 DIAGNOSIS — R61 Generalized hyperhidrosis: Secondary | ICD-10-CM | POA: Insufficient documentation

## 2013-09-26 DIAGNOSIS — F329 Major depressive disorder, single episode, unspecified: Secondary | ICD-10-CM | POA: Insufficient documentation

## 2013-09-26 DIAGNOSIS — F3289 Other specified depressive episodes: Secondary | ICD-10-CM | POA: Insufficient documentation

## 2013-09-26 DIAGNOSIS — Z79899 Other long term (current) drug therapy: Secondary | ICD-10-CM | POA: Insufficient documentation

## 2013-09-26 DIAGNOSIS — Z880 Allergy status to penicillin: Secondary | ICD-10-CM | POA: Insufficient documentation

## 2013-09-26 DIAGNOSIS — R5383 Other fatigue: Secondary | ICD-10-CM

## 2013-09-26 DIAGNOSIS — R5381 Other malaise: Secondary | ICD-10-CM | POA: Insufficient documentation

## 2013-09-26 DIAGNOSIS — H538 Other visual disturbances: Secondary | ICD-10-CM | POA: Insufficient documentation

## 2013-09-26 DIAGNOSIS — R51 Headache: Secondary | ICD-10-CM | POA: Insufficient documentation

## 2013-09-26 DIAGNOSIS — F411 Generalized anxiety disorder: Secondary | ICD-10-CM | POA: Insufficient documentation

## 2013-09-26 DIAGNOSIS — I471 Supraventricular tachycardia: Secondary | ICD-10-CM

## 2013-09-26 DIAGNOSIS — I498 Other specified cardiac arrhythmias: Secondary | ICD-10-CM | POA: Insufficient documentation

## 2013-09-26 DIAGNOSIS — G43909 Migraine, unspecified, not intractable, without status migrainosus: Secondary | ICD-10-CM | POA: Insufficient documentation

## 2013-09-26 LAB — CBC WITH DIFFERENTIAL/PLATELET
BASOS ABS: 0.1 10*3/uL (ref 0.0–0.1)
Basophils Relative: 1 % (ref 0–1)
EOS ABS: 1.2 10*3/uL — AB (ref 0.0–0.7)
Eosinophils Relative: 10 % — ABNORMAL HIGH (ref 0–5)
HCT: 37 % (ref 36.0–46.0)
Hemoglobin: 12.4 g/dL (ref 12.0–15.0)
Lymphocytes Relative: 24 % (ref 12–46)
Lymphs Abs: 2.9 10*3/uL (ref 0.7–4.0)
MCH: 30.5 pg (ref 26.0–34.0)
MCHC: 33.5 g/dL (ref 30.0–36.0)
MCV: 91.1 fL (ref 78.0–100.0)
Monocytes Absolute: 0.5 10*3/uL (ref 0.1–1.0)
Monocytes Relative: 4 % (ref 3–12)
NEUTROS PCT: 61 % (ref 43–77)
Neutro Abs: 7.3 10*3/uL (ref 1.7–7.7)
PLATELETS: 441 10*3/uL — AB (ref 150–400)
RBC: 4.06 MIL/uL (ref 3.87–5.11)
RDW: 14 % (ref 11.5–15.5)
WBC: 11.9 10*3/uL — ABNORMAL HIGH (ref 4.0–10.5)

## 2013-09-26 LAB — BASIC METABOLIC PANEL
BUN: 8 mg/dL (ref 6–23)
CO2: 24 mEq/L (ref 19–32)
Calcium: 9.1 mg/dL (ref 8.4–10.5)
Chloride: 103 mEq/L (ref 96–112)
Creatinine, Ser: 0.9 mg/dL (ref 0.50–1.10)
GFR calc Af Amer: 88 mL/min — ABNORMAL LOW (ref >90)
GFR, EST NON AFRICAN AMERICAN: 76 mL/min — AB (ref >90)
Glucose, Bld: 137 mg/dL — ABNORMAL HIGH (ref 70–99)
Potassium: 3.8 mEq/L (ref 3.7–5.3)
SODIUM: 141 meq/L (ref 137–147)

## 2013-09-26 MED ORDER — ADENOSINE 6 MG/2ML IV SOLN
6.0000 mg | Freq: Once | INTRAVENOUS | Status: AC
  Administered 2013-09-26: 6 mg via INTRAVENOUS

## 2013-09-26 MED ORDER — ADENOSINE 6 MG/2ML IV SOLN
INTRAVENOUS | Status: AC
  Administered 2013-09-26: 6 mg via INTRAVENOUS
  Filled 2013-09-26: qty 2

## 2013-09-26 MED ORDER — METOPROLOL TARTRATE 50 MG PO TABS: 50.0000 mg | ORAL_TABLET | ORAL | Status: DC | PRN

## 2013-09-26 NOTE — ED Provider Notes (Addendum)
CSN: [id number]     Arrival date & time 09/26/13  7829 History   First MD Initiated Contact with Patient 09/26/13 1008     Chief Complaint  Patient presents with  . Tachycardia     (Consider location/radiation/quality/duration/timing/severity/associated sxs/prior Treatment) Patient is a 48 y.o. female presenting with palpitations. The history is provided by the patient.  Palpitations Palpitations quality:  Fast Onset quality:  Sudden Duration:  1 hour Timing:  Constant Progression:  Unchanged Chronicity:  Recurrent Context: not bronchodilators, not caffeine, not hyperventilation, not illicit drugs, not nicotine and not stimulant use   Context comment:  States recently had a very heavy menses but no other change Relieved by:  Nothing Worsened by:  Nothing tried Ineffective treatments:  Deep relaxation, valsalva and breathing exercises Associated symptoms: diaphoresis and weakness   Associated symptoms: no back pain, no chest pain, no chest pressure, no cough, no shortness of breath, no syncope and no vomiting   Associated symptoms comment:  Headache and blurred vision Risk factors comment:  Hx of SVT   Past Medical History  Diagnosis Date  . SVT (supraventricular tachycardia)   . Depression   . Obesity   . Migraine    Past Surgical History  Procedure Laterality Date  . Angioma cautery    . Toe surgery     No family history on file. History  Substance Use Topics  . Smoking status: Never Smoker   . Smokeless tobacco: Not on file  . Alcohol Use: No   OB History   Grav Para Term Preterm Abortions TAB SAB Ect Mult Living                 Review of Systems  Constitutional: Positive for diaphoresis.  Respiratory: Negative for cough and shortness of breath.   Cardiovascular: Positive for palpitations. Negative for chest pain and syncope.  Gastrointestinal: Negative for vomiting.  Musculoskeletal: Negative for back pain.  All other systems reviewed and are  negative.      Allergies  Iodine and Penicillins  Home Medications   Current Outpatient Rx  Name  Route  Sig  Dispense  Refill  . acetaminophen (TYLENOL) 500 MG tablet   Oral   Take 1,000 mg by mouth every 6 (six) hours as needed. Patient used this medication for pain.         . cloNIDine (CATAPRES) 0.1 MG tablet   Oral   Take 0.1 mg by mouth at bedtime.         . DULoxetine (CYMBALTA) 60 MG capsule   Oral   Take 60 mg by mouth daily.         . fluticasone (FLONASE) 50 MCG/ACT nasal spray   Nasal   Place 2 sprays into the nose daily.         Marland Kitchen ibuprofen (ADVIL,MOTRIN) 200 MG tablet   Oral   Take 600 mg by mouth every 6 (six) hours as needed. Patient used this medication for pain.         Marland Kitchen lamoTRIgine (LAMICTAL) 200 MG tablet   Oral   Take 200 mg by mouth daily.         Marland Kitchen trolamine salicylate (ASPERCREME) 10 % cream   Topical   Apply topically as needed. Patient used this medication for foot and shoulder pain.         Marland Kitchen zolmitriptan (ZOMIG) 5 MG tablet   Oral   Take 5 mg by mouth as needed. Patient took 2.5 mg of this  medication today.         . zolmitriptan (ZOMIG) 5 MG tablet   Oral   Take 1 tablet (5 mg total) by mouth as needed for migraine.   10 tablet   0    BP 127/86  Pulse 92  Temp(Src) 98.7 F (37.1 C) (Oral)  Resp 18  SpO2 100% Physical Exam  Nursing note and vitals reviewed. Constitutional: She is oriented to person, place, and time. She appears well-developed and well-nourished. She appears distressed.  Pt appears anxious  HENT:  Head: Normocephalic and atraumatic.  Eyes: EOM are normal. Pupils are equal, round, and reactive to light.  Cardiovascular: Regular rhythm, normal heart sounds and intact distal pulses.  Tachycardia present.  Exam reveals no friction rub.   No murmur heard. Pulmonary/Chest: Effort normal and breath sounds normal. She has no wheezes. She has no rales.  Abdominal: Soft. Bowel sounds are normal. She  exhibits no distension. There is no tenderness. There is no rebound and no guarding.  Musculoskeletal: Normal range of motion. She exhibits no tenderness.  No edema  Neurological: She is alert and oriented to person, place, and time. No cranial nerve deficit.  Skin: Skin is warm. No rash noted. She is diaphoretic.  Psychiatric: She has a normal mood and affect. Her behavior is normal.    ED Course  Procedures (including critical care time) Labs Review Labs Reviewed  CBC WITH DIFFERENTIAL - Abnormal; Notable for the following:    WBC 11.9 (*)    Platelets 441 (*)    Eosinophils Relative 10 (*)    Eosinophils Absolute 1.2 (*)    All other components within normal limits  BASIC METABOLIC PANEL - Abnormal; Notable for the following:    Glucose, Bld 137 (*)    GFR calc non Af Amer 76 (*)    GFR calc Af Amer 88 (*)    All other components within normal limits   Imaging Review No results found.   EKG Interpretation   Date/Time:  Sunday September 26 2013 10:30:30 EDT Ventricular Rate:  93 PR Interval:  160 QRS Duration: 82 QT Interval:  366 QTC Calculation: 455 R Axis:   51 Text Interpretation:  Normal sinus rhythm Nonspecific ST abnormality  Supraventricular tachycardia RESOLVED SINCE PREVIOUS Confirmed by Anitra LauthPLUNKETT   MD, Alphonzo LemmingsWHITNEY ([id number]) on 09/26/2013 10:56:08 AM      MDM   Final diagnoses:  SVT (supraventricular tachycardia)    Patient with a history of long-standing SVT who takes no prophylactic therapy at this time presents today with onset of palpitations this started about one hour ago then it not resolved with Valsalva, carotid massage or other techniques attempted at home. Patient is complaining of having a headache, mild blurry vision and just feeling generally not well which started about 10 minutes after the palpitations. She denies any chest pain or shortness of breath. She is hemodynamically stable an EKG is consistent with SVT similar to prior EKGs. After 6 mg of  adenosine patient returned to sinus rhythm a repeat EKG shows sinus rhythm with mild ST segment depression. Patient recently had a very heavy menses with the 2 g drop in her hemoglobin from 14-12 but otherwise labs are unchanged. On reevaluation the patient feels back to baseline and much better. She had been on beta blockers in the past but states they make her feel tired. Patient given a prescription for beta blocker to use as abortive therapy when symptoms start and was referred to cardiology.  Pt  chemically cardioverted with 6mg  of adenosine.  Zol pads placed and pt placed on o2 with IV line present.  After 6mg  of adenosine pt converted to sinus rhythm.  Gwyneth Sprout, MD 09/26/13 1108  Gwyneth Sprout, MD 09/26/13 1123

## 2013-09-26 NOTE — ED Notes (Signed)
Patient not experiencing any SOB, no chest pressure, or headache at discharge

## 2013-09-26 NOTE — ED Notes (Signed)
Patient states that she felt her heart racing approx 45 minutes ago

## 2013-09-26 NOTE — ED Notes (Signed)
Patient placed on cardiac monitor, Zoll  Pads applied to L side front and back & crash cart at bedside prior to adenosine administration, patient placed on 2l oxygen, MD at bedside

## 2013-12-03 ENCOUNTER — Ambulatory Visit: Payer: BC Managed Care – PPO | Admitting: Cardiovascular Disease

## 2014-02-16 ENCOUNTER — Encounter (HOSPITAL_BASED_OUTPATIENT_CLINIC_OR_DEPARTMENT_OTHER): Payer: Self-pay | Admitting: Emergency Medicine

## 2014-02-16 ENCOUNTER — Emergency Department (HOSPITAL_BASED_OUTPATIENT_CLINIC_OR_DEPARTMENT_OTHER)
Admission: EM | Admit: 2014-02-16 | Discharge: 2014-02-16 | Disposition: A | Discharge status: Home / Self Care | Payer: BC Managed Care – PPO | Attending: Emergency Medicine | Admitting: Emergency Medicine

## 2014-02-16 DIAGNOSIS — G43909 Migraine, unspecified, not intractable, without status migrainosus: Secondary | ICD-10-CM | POA: Insufficient documentation

## 2014-02-16 DIAGNOSIS — F329 Major depressive disorder, single episode, unspecified: Secondary | ICD-10-CM | POA: Insufficient documentation

## 2014-02-16 DIAGNOSIS — R002 Palpitations: Secondary | ICD-10-CM | POA: Insufficient documentation

## 2014-02-16 DIAGNOSIS — IMO0002 Reserved for concepts with insufficient information to code with codable children: Secondary | ICD-10-CM | POA: Insufficient documentation

## 2014-02-16 DIAGNOSIS — Z79899 Other long term (current) drug therapy: Secondary | ICD-10-CM | POA: Insufficient documentation

## 2014-02-16 DIAGNOSIS — Z880 Allergy status to penicillin: Secondary | ICD-10-CM | POA: Insufficient documentation

## 2014-02-16 DIAGNOSIS — F3289 Other specified depressive episodes: Secondary | ICD-10-CM | POA: Insufficient documentation

## 2014-02-16 DIAGNOSIS — E669 Obesity, unspecified: Secondary | ICD-10-CM | POA: Insufficient documentation

## 2014-02-16 LAB — BASIC METABOLIC PANEL
ANION GAP: 13 (ref 5–15)
BUN: 14 mg/dL (ref 6–23)
CO2: 25 mEq/L (ref 19–32)
CREATININE: 0.9 mg/dL (ref 0.50–1.10)
Calcium: 9.6 mg/dL (ref 8.4–10.5)
Chloride: 104 mEq/L (ref 96–112)
GFR calc Af Amer: 88 mL/min — ABNORMAL LOW (ref >90)
GFR calc non Af Amer: 76 mL/min — ABNORMAL LOW (ref >90)
Glucose, Bld: 96 mg/dL (ref 70–99)
Potassium: 4.1 mEq/L (ref 3.7–5.3)
Sodium: 142 mEq/L (ref 137–147)

## 2014-02-16 NOTE — ED Provider Notes (Signed)
CSN: [id number]     Arrival date & time 02/16/14  1631 History   First MD Initiated Contact with Patient 02/16/14 1642     Chief Complaint  Patient presents with  . Palpitations     (Consider location/radiation/quality/duration/timing/severity/associated sxs/prior Treatment) Patient is a 48 y.o. female presenting with palpitations. The history is provided by the patient.  Palpitations Associated symptoms: no back pain, no chest pain, no nausea, no shortness of breath and no vomiting    patient with history of supraventricular tachycardia. Patient had bouts of this occurring at home. Upon arrival here heart rate was 166. The proximal one hour prior arrival patient had taken her beta blocker. She came in because it did not slow her heart rate down. Shortly after arriving here when EKG was done heart rate was down into the 90s and was normal sinus rhythm. Patient most likely did have a bout of her supraventricular tachycardia problem. Patient's postpartum Lopressor [employer] but she doesn't like the way it makes her feel so she doesn't take it. Patient is followed by cardiology. Patient denies any shortness of breath or chest pain or any other symptoms. Patient now feels better.  Past Medical History  Diagnosis Date  . SVT (supraventricular tachycardia)   . Depression   . Obesity   . Migraine    Past Surgical History  Procedure Laterality Date  . Angioma cautery    . Toe surgery     No family history on file. History  Substance Use Topics  . Smoking status: Never Smoker   . Smokeless tobacco: Not on file  . Alcohol Use: No   OB History   Grav Para Term Preterm Abortions TAB SAB Ect Mult Living                 Review of Systems  Constitutional: Negative for fever.  Respiratory: Negative for shortness of breath.   Cardiovascular: Positive for palpitations. Negative for chest pain.  Gastrointestinal: Negative for nausea, vomiting and abdominal pain.  Musculoskeletal:  Negative for back pain.  Skin: Negative for rash.  Neurological: Negative for syncope and headaches.  Hematological: Does not bruise/bleed easily.  Psychiatric/Behavioral: Negative for confusion.      Allergies  Iodine and Penicillins  Home Medications   Prior to Admission medications   Medication Sig Start Date End Date Taking? Authorizing Provider  acetaminophen (TYLENOL) 500 MG tablet Take 1,000 mg by mouth every 6 (six) hours as needed. Patient used this medication for pain.    Historical Provider, MD  cloNIDine (CATAPRES) 0.1 MG tablet Take 0.1 mg by mouth at bedtime.    Historical Provider, MD  DULoxetine (CYMBALTA) 60 MG capsule Take 60 mg by mouth daily.    Historical Provider, MD  fluticasone (FLONASE) 50 MCG/ACT nasal spray Place 2 sprays into the nose daily.    Historical Provider, MD  ibuprofen (ADVIL,MOTRIN) 200 MG tablet Take 600 mg by mouth every 6 (six) hours as needed. Patient used this medication for pain.    Historical Provider, MD  lamoTRIgine (LAMICTAL) 200 MG tablet Take 200 mg by mouth daily.    Historical Provider, MD  metoprolol (LOPRESSOR) 50 MG tablet Take 1 tablet (50 mg total) by mouth as needed (for palpitations). 09/26/13   Gwyneth Sprout, MD  trolamine salicylate (ASPERCREME) 10 % cream Apply topically as needed. Patient used this medication for foot and shoulder pain.    Historical Provider, MD  zolmitriptan (ZOMIG) 5 MG tablet Take 5 mg by mouth  as needed. Patient took 2.5 mg of this medication today.    Historical Provider, MD  zolmitriptan (ZOMIG) 5 MG tablet Take 1 tablet (5 mg total) by mouth as needed for migraine. 10/31/12   Elson Areas, PA-C   BP 103/74  Pulse 85  Temp(Src) 98 F (36.7 C) (Oral)  Resp 16  Ht  (1.651 m)  Wt 280 lb (127.007 kg)  BMI 46.59 kg/m2  SpO2 99% Physical Exam  Nursing note and vitals reviewed. Constitutional: She is oriented to person, place, and time. She appears well-developed and well-nourished. No  distress.  HENT:  Head: Normocephalic and atraumatic.  Mouth/Throat: Oropharynx is clear and moist.  Eyes: Conjunctivae and EOM are normal. Pupils are equal, round, and reactive to light.  Neck: Normal range of motion.  Cardiovascular: Normal rate, regular rhythm and normal heart sounds.   No murmur heard. Pulmonary/Chest: Effort normal and breath sounds normal. No respiratory distress.  Abdominal: Soft. Bowel sounds are normal. There is no tenderness.  Musculoskeletal: She exhibits no edema.  Neurological: She is alert and oriented to person, place, and time. No cranial nerve deficit. She exhibits normal muscle tone. Coordination normal.  Skin: Skin is warm. No rash noted.    ED Course  Procedures (including critical care time) Labs Review Labs Reviewed  BASIC METABOLIC PANEL - Abnormal; Notable for the following:    GFR calc non Af Amer 76 (*)    GFR calc Af Amer 88 (*)    All other components within normal limits   Results for orders placed during the hospital encounter of 02/16/14  BASIC METABOLIC PANEL      Result Value Ref Range   Sodium 142  137 - 147 mEq/L   Potassium 4.1  3.7 - 5.3 mEq/L   Chloride 104  96 - 112 mEq/L   CO2 25  19 - 32 mEq/L   Glucose, Bld 96  70 - 99 mg/dL   BUN 14  6 - 23 mg/dL   Creatinine, Ser 6.29  0.50 - 1.10 mg/dL   Calcium 9.6  8.4 - 52.8 mg/dL   GFR calc non Af Amer 76 (*) >90 mL/min   GFR calc Af Amer 88 (*) >90 mL/min   Anion gap 13  5 - 15     Imaging Review No results found.   EKG Interpretation   Date/Time:  Wednesday February 16 2014 16:42:13 EDT Ventricular Rate:  90 PR Interval:  152 QRS Duration: 80 QT Interval:  354 QTC Calculation: 433 R Axis:   37 Text Interpretation:  Normal sinus rhythm Possible Left atrial enlargement  Borderline ECG No significant change since last tracing Confirmed by  Kinzie Wickes  MD, Veva Grimley ([id number]) on 02/16/2014 4:44:31 PM      MDM   Final diagnoses:  Rapid palpitations    The patient  with history of supraventricular tachycardia. Patients on beta blocker at the advice of her cardiologist help control this. She doesn't like the way it makes her feel. Patient did take a beta blocker approximately hour prior to arrival. Heart rate upon arrival was 166. At that time EKG was done heart rate was down into the 90s it was a normal sinus rhythm. Presumed that she had converted. Patient's heart rate now is down to 83. His been no more bouts of SVT or any other arrhythmias. Patient did have any chest pain or shortness of breath associated with. Patient had a long-standing history of SVT.    Vanetta Mulders, MD  02/16/14 1815 

## 2014-02-16 NOTE — Discharge Instructions (Signed)
Recommend taking a beta blocker or regular basis as recommended by your cardiologist. Followup with her cardiologist as needed. Return for recurrent rapid heart rate lasting 40 minutes or longer.

## 2014-02-16 NOTE — ED Notes (Signed)
Pt has hx of episodes. HR was 166 on arrival to traige. HR now 104. Beta blocker taken 1 hr 20 minutes ago. Dizziness and clamminess

## 2014-02-16 NOTE — ED Notes (Signed)
MD at bedside. 

## 2014-02-18 ENCOUNTER — Emergency Department (HOSPITAL_BASED_OUTPATIENT_CLINIC_OR_DEPARTMENT_OTHER)
Admission: EM | Admit: 2014-02-18 | Discharge: 2014-02-18 | Disposition: A | Discharge status: Home / Self Care | Payer: BC Managed Care – PPO | Attending: Emergency Medicine | Admitting: Emergency Medicine

## 2014-02-18 ENCOUNTER — Encounter (HOSPITAL_BASED_OUTPATIENT_CLINIC_OR_DEPARTMENT_OTHER): Payer: Self-pay | Admitting: Emergency Medicine

## 2014-02-18 DIAGNOSIS — I471 Supraventricular tachycardia, unspecified: Secondary | ICD-10-CM | POA: Insufficient documentation

## 2014-02-18 DIAGNOSIS — Z79899 Other long term (current) drug therapy: Secondary | ICD-10-CM | POA: Insufficient documentation

## 2014-02-18 DIAGNOSIS — Z880 Allergy status to penicillin: Secondary | ICD-10-CM | POA: Insufficient documentation

## 2014-02-18 DIAGNOSIS — E669 Obesity, unspecified: Secondary | ICD-10-CM | POA: Insufficient documentation

## 2014-02-18 DIAGNOSIS — R002 Palpitations: Secondary | ICD-10-CM | POA: Insufficient documentation

## 2014-02-18 DIAGNOSIS — G43909 Migraine, unspecified, not intractable, without status migrainosus: Secondary | ICD-10-CM | POA: Insufficient documentation

## 2014-02-18 DIAGNOSIS — F329 Major depressive disorder, single episode, unspecified: Secondary | ICD-10-CM | POA: Insufficient documentation

## 2014-02-18 DIAGNOSIS — F3289 Other specified depressive episodes: Secondary | ICD-10-CM | POA: Insufficient documentation

## 2014-02-18 DIAGNOSIS — IMO0002 Reserved for concepts with insufficient information to code with codable children: Secondary | ICD-10-CM | POA: Insufficient documentation

## 2014-02-18 LAB — BASIC METABOLIC PANEL
Anion gap: 16 — ABNORMAL HIGH (ref 5–15)
BUN: 12 mg/dL (ref 6–23)
CO2: 24 mEq/L (ref 19–32)
Calcium: 10.1 mg/dL (ref 8.4–10.5)
Chloride: 101 mEq/L (ref 96–112)
Creatinine, Ser: 0.9 mg/dL (ref 0.50–1.10)
GFR calc non Af Amer: 76 mL/min — ABNORMAL LOW (ref >90)
GFR, EST AFRICAN AMERICAN: 88 mL/min — AB (ref >90)
GLUCOSE: 120 mg/dL — AB (ref 70–99)
Potassium: 3.8 mEq/L (ref 3.7–5.3)
SODIUM: 141 meq/L (ref 137–147)

## 2014-02-18 LAB — CBC
HCT: 42.1 % (ref 36.0–46.0)
HEMOGLOBIN: 13.9 g/dL (ref 12.0–15.0)
MCH: 28.1 pg (ref 26.0–34.0)
MCHC: 33 g/dL (ref 30.0–36.0)
MCV: 85.2 fL (ref 78.0–100.0)
Platelets: 410 10*3/uL — ABNORMAL HIGH (ref 150–400)
RBC: 4.94 MIL/uL (ref 3.87–5.11)
RDW: 15.9 % — AB (ref 11.5–15.5)
WBC: 15.4 10*3/uL — AB (ref 4.0–10.5)

## 2014-02-18 MED ORDER — DILTIAZEM HCL ER COATED BEADS 120 MG PO CP24: 120.0000 mg | ORAL_CAPSULE | Freq: Every day | ORAL | Status: DC

## 2014-02-18 NOTE — ED Provider Notes (Signed)
CSN: [id number]     Arrival date & time 02/18/14  2004 History  This chart was scribed for Elwin Mocha, MD by Bronson Curb, ED Scribe. This patient was seen in room MHT14/MHT14 and the patient's care was started at 8:37 PM.     Chief Complaint  Patient presents with  . Palpitations     Patient is a 48 y.o. female presenting with palpitations. The history is provided by the patient. No language interpreter was used.  Palpitations Palpitations quality:  Fast Onset quality:  Sudden Timing:  Sporadic Chronicity:  Recurrent Context: not caffeine and not hyperventilation   Relieved by:  Beta blockers Associated symptoms: no chest pain, no dizziness and no shortness of breath   Risk factors: no diabetes mellitus, no hx of atrial fibrillation, no hx of DVT and no hx of PE     HPI Comments: Jennifer Howard is a 48 y.o. female who presents to the Emergency Department complaining of palpitations onset. Patient was seen here 2 days ago for the same. She reports history of the same and has seen by a specialist where she was informed she has SVT. She states she can go months without experiencing an episode. However, today, she reports 2 episodes this week (2 days ago and today). She reports this episode lasted at least 3 hours. There is associated pallor and clammy skin. She states she takes beta blockers (Bystolic), but does not take them daily. She reports the beta blockers make her sleepy and states she rather not take them. Patient has taken took 2 beta blockers today, and states her rate "felt slower", however, her heart rate upon arrival was 191. She denies CP or SOB. Patient does not consume caffeine, but states she did have a cup of coffee 2 days ago.   Seen by Arnette Felts, PA-C at [facility]  Past Medical History  Diagnosis Date  . SVT (supraventricular tachycardia)   . Depression   . Obesity   . Migraine    Past Surgical History  Procedure Laterality Date  . Angioma cautery     . Toe surgery     No family history on file. History  Substance Use Topics  . Smoking status: Never Smoker   . Smokeless tobacco: Not on file  . Alcohol Use: No   OB History   Grav Para Term Preterm Abortions TAB SAB Ect Mult Living                 Review of Systems  Constitutional: Negative for fever and chills.  Respiratory: Negative for shortness of breath.   Cardiovascular: Positive for palpitations. Negative for chest pain.  Neurological: Negative for dizziness.  All other systems reviewed and are negative.     Allergies  Iodine and Penicillins  Home Medications   Prior to Admission medications   Medication Sig Start Date End Date Taking? Authorizing Provider  acetaminophen (TYLENOL) 500 MG tablet Take 1,000 mg by mouth every 6 (six) hours as needed. Patient used this medication for pain.    Historical Provider, MD  cloNIDine (CATAPRES) 0.1 MG tablet Take 0.1 mg by mouth at bedtime.    Historical Provider, MD  DULoxetine (CYMBALTA) 60 MG capsule Take 60 mg by mouth daily.    Historical Provider, MD  fluticasone (FLONASE) 50 MCG/ACT nasal spray Place 2 sprays into the nose daily.    Historical Provider, MD  ibuprofen (ADVIL,MOTRIN) 200 MG tablet Take 600 mg by mouth every 6 (six) hours as needed.  Patient used this medication for pain.    Historical Provider, MD  lamoTRIgine (LAMICTAL) 200 MG tablet Take 200 mg by mouth daily.    Historical Provider, MD  metoprolol (LOPRESSOR) 50 MG tablet Take 1 tablet (50 mg total) by mouth as needed (for palpitations). 09/26/13   Gwyneth Sprout, MD  trolamine salicylate (ASPERCREME) 10 % cream Apply topically as needed. Patient used this medication for foot and shoulder pain.    Historical Provider, MD  zolmitriptan (ZOMIG) 5 MG tablet Take 5 mg by mouth as needed. Patient took 2.5 mg of this medication today.    Historical Provider, MD  zolmitriptan (ZOMIG) 5 MG tablet Take 1 tablet (5 mg total) by mouth as needed for migraine.  10/31/12   Elson Areas, PA-C   Triage Vitals: BP 112/81  Pulse 191  Ht  (1.651 m)  Wt 280 lb (127.007 kg)  BMI 46.59 kg/m2  SpO2 100%  Physical Exam  Nursing note and vitals reviewed. Constitutional: She is oriented to person, place, and time. She appears well-developed and well-nourished. No distress.  HENT:  Head: Normocephalic and atraumatic.  Eyes: EOM are normal. Pupils are equal, round, and reactive to light.  Neck: Normal range of motion. Neck supple.  Cardiovascular: Normal rate and regular rhythm.  Exam reveals no friction rub.   No murmur heard. Pulmonary/Chest: Effort normal and breath sounds normal. No respiratory distress. She has no wheezes. She has no rales.  Abdominal: Soft. She exhibits no distension. There is no tenderness. There is no rebound.  Musculoskeletal: Normal range of motion. She exhibits no edema.  Neurological: She is alert and oriented to person, place, and time.  Skin: She is not diaphoretic.    ED Course  Procedures (including critical care time)  DIAGNOSTIC STUDIES: Oxygen Saturation is 100% on room air, normal by my interpretation.    COORDINATION OF CARE: At 2048 Discussed treatment plan with patient which includes observation, labs, and consult to cardiology. Patient agrees.   Labs Review Labs Reviewed - No data to display  Imaging Review No results found.   EKG Interpretation   Date/Time:  Friday February 18 2014 20:17:37 EDT Ventricular Rate:  116 PR Interval:  160 QRS Duration: 88 QT Interval:  316 QTC Calculation: 439 R Axis:   66 Text Interpretation:  Sinus tachycardia Otherwise normal ECG Similar to  prior Confirmed by Gwendolyn Grant  MD, Declynn Lopresti ([id number]) on 02/18/2014 11:38:54 PM      MDM   Final diagnoses:  Palpitations  SVT (supraventricular tachycardia)    48 year old female with history of SVT here with palpitations. Has had month without problems, this is second episode this week. This was to be taking beta  blockers but doesn't because they make her feel horrible. It SVT in triage with rate in the 190s, however converted upon my exam back to sinus tachycardia. Rate continued to drop into the 90 during  her observation. Lites okay. I spoke with cardiology, Dr. Desma Maxim, who recommmended diltiazem for rate control as an alternative to beta-blockers. Given Rx to patient, given f/u for EP cardiology with [facility] in [place]. Stable for discharge.  I personally performed the services described in this documentation, which was scribed in my presence. The recorded information has been reviewed and is accurate.     Elwin Mocha, MD 02/18/14 [phone]

## 2014-02-18 NOTE — Discharge Instructions (Signed)
Supraventricular Tachycardia °Supraventricular tachycardia (SVT) is an abnormal heart rhythm (arrhythmia) that causes the heart to beat very fast (tachycardia). This kind of fast heartbeat originates in the upper chambers of the heart (atria). SVT can cause the heart to beat greater than 100 beats per minute. SVT can have a rapid burst of heartbeats. This can start and stop suddenly without warning and is called nonsustained. SVT can also be sustained, in which the heart beats at a continuous fast rate.  °CAUSES  °There can be different causes of SVT. Some of these include: °· Heart valve problems such as mitral valve prolapse. °· An enlarged heart (hypertrophic cardiomyopathy). °· Congenital heart problems. °· Heart inflammation (pericarditis). °· Hyperthyroidism. °· Low potassium or magnesium levels. °· Caffeine. °· Drug use such as cocaine, methamphetamines, or stimulants. °· Some over-the-counter medicines such as: °¨ Decongestants. °¨ Diet medicines. °¨ Herbal medicines. °SYMPTOMS  °Symptoms of SVT can vary. Symptoms depend on whether the SVT is sustained or nonsustained. You may experience: °· No symptoms (asymptomatic). °· An awareness of your heart beating rapidly (palpitations). °· Shortness of breath. °· Chest pain or pressure. °If your blood pressure drops because of the SVT, you may experience: °· Fainting or near fainting. °· Weakness. °· Dizziness. °DIAGNOSIS  °Different tests can be performed to diagnose SVT, such as: °· An electrocardiogram (EKG). This is a painless test that records the electrical activity of your heart. °· Holter monitor. This is a 24 hour recording of your heart rhythm. You will be given a diary. Write down all symptoms that you have and what you were doing at the time you experienced symptoms. °· Arrhythmia monitor. This is a small device that your wear for several weeks. It records the heart rhythm when you have symptoms. °· Echocardiogram. This is an imaging test to help detect  abnormal heart structure such as congenital abnormalities, heart valve problems, or heart enlargement. °· Stress test. This test can help determine if the SVT is related to exercise. °· Electrophysiology study (EPS). This is a procedure that evaluates your heart's electrical system and can help your caregiver find the cause of your SVT. °TREATMENT  °Treatment of SVT depends on the symptoms, how often it recurs, and whether there are any underlying heart problems.  °· If symptoms are rare and no other cardiac disease is present, no treatment may be needed. °· Blood work may be done to check potassium, magnesium, and thyroid hormone levels to see if they are abnormal. If these levels are abnormal, treatment to correct the problems will occur. °Medicines °Your caregiver may use oral medicines to treat SVT. These medicines are given for long-term control of SVT. Medicines may be used alone or in combination with other treatments. These medicines work to slow nerve impulses in the heart muscle. These medicines can also be used to treat high blood pressure. Some of these medicines may include: °· Calcium channel blockers. °· Beta blockers. °· Digoxin. °Nonsurgical procedures °Nonsurgical techniques may be used if oral medicines do not work. Some examples include: °· Cardioversion. This technique uses either drugs or an electrical shock to restore a normal heart rhythm. °¨ Cardioversion drugs may be given through an intravenous (IV) line to help "reset" the heart rhythm. °¨ In electrical cardioversion, the caregiver shocks your heart to stop its beat for a split second. This helps to reset the heart to a normal rhythm. °· Ablation. This procedure is done under mild sedation. High frequency radio wave energy is used to   destroy the area of heart tissue responsible for the SVT. °HOME CARE INSTRUCTIONS  °· Do not smoke. °· Only take medicines prescribed by your caregiver. Check with your caregiver before using over-the-counter  medicines. °· Check with your caregiver about how much alcohol and caffeine (coffee, tea, colas, or chocolate) you may have. °· It is very important to keep all follow-up referrals and appointments in order to properly manage this problem. °SEEK IMMEDIATE MEDICAL CARE IF: °· You have dizziness. °· You faint or nearly faint. °· You have shortness of breath. °· You have chest pain or pressure. °· You have sudden nausea or vomiting. °· You have profuse sweating. °· You are concerned about how long your symptoms last. °· You are concerned about the frequency of your SVT episodes. °If you have the above symptoms, call your local emergency services (911 in [place] immediately. Do not drive yourself to the hospital. °MAKE SURE YOU:  °· Understand these instructions. °· Will watch your condition. °· Will get help right away if you are not doing well or get worse. °Document Released: 06/10/2005 Document Revised: 09/02/2011 Document Reviewed: 09/22/2008 °[employer]® Patient Information ©2015 [employer]. This information is not intended to replace advice given to you by your health care provider. Make sure you discuss any questions you have with your health care provider. ° °

## 2014-03-16 ENCOUNTER — Encounter (HOSPITAL_BASED_OUTPATIENT_CLINIC_OR_DEPARTMENT_OTHER): Payer: Self-pay | Admitting: Emergency Medicine

## 2014-03-16 ENCOUNTER — Emergency Department (HOSPITAL_BASED_OUTPATIENT_CLINIC_OR_DEPARTMENT_OTHER)
Admission: EM | Admit: 2014-03-16 | Discharge: 2014-03-16 | Disposition: A | Discharge status: Home / Self Care | Payer: BC Managed Care – PPO | Attending: Emergency Medicine | Admitting: Emergency Medicine

## 2014-03-16 DIAGNOSIS — Z79899 Other long term (current) drug therapy: Secondary | ICD-10-CM | POA: Insufficient documentation

## 2014-03-16 DIAGNOSIS — F329 Major depressive disorder, single episode, unspecified: Secondary | ICD-10-CM | POA: Insufficient documentation

## 2014-03-16 DIAGNOSIS — R000 Tachycardia, unspecified: Secondary | ICD-10-CM | POA: Insufficient documentation

## 2014-03-16 DIAGNOSIS — I498 Other specified cardiac arrhythmias: Secondary | ICD-10-CM | POA: Insufficient documentation

## 2014-03-16 DIAGNOSIS — IMO0002 Reserved for concepts with insufficient information to code with codable children: Secondary | ICD-10-CM | POA: Insufficient documentation

## 2014-03-16 DIAGNOSIS — R51 Headache: Secondary | ICD-10-CM | POA: Insufficient documentation

## 2014-03-16 DIAGNOSIS — R5381 Other malaise: Secondary | ICD-10-CM | POA: Insufficient documentation

## 2014-03-16 DIAGNOSIS — R5383 Other fatigue: Secondary | ICD-10-CM

## 2014-03-16 DIAGNOSIS — Z880 Allergy status to penicillin: Secondary | ICD-10-CM | POA: Insufficient documentation

## 2014-03-16 DIAGNOSIS — R002 Palpitations: Secondary | ICD-10-CM | POA: Insufficient documentation

## 2014-03-16 DIAGNOSIS — E669 Obesity, unspecified: Secondary | ICD-10-CM | POA: Insufficient documentation

## 2014-03-16 DIAGNOSIS — G43909 Migraine, unspecified, not intractable, without status migrainosus: Secondary | ICD-10-CM | POA: Insufficient documentation

## 2014-03-16 DIAGNOSIS — I471 Supraventricular tachycardia: Secondary | ICD-10-CM

## 2014-03-16 DIAGNOSIS — F3289 Other specified depressive episodes: Secondary | ICD-10-CM | POA: Insufficient documentation

## 2014-03-16 MED ORDER — ADENOSINE 6 MG/2ML IV SOLN
6.0000 mg | Freq: Once | INTRAVENOUS | Status: AC
  Administered 2014-03-16: 6 mg via INTRAVENOUS

## 2014-03-16 MED ORDER — ADENOSINE 6 MG/2ML IV SOLN
INTRAVENOUS | Status: AC
  Administered 2014-03-16: 6 mg via INTRAVENOUS
  Filled 2014-03-16: qty 2

## 2014-03-16 NOTE — ED Notes (Signed)
Pt is now in nsr, rate of 90. Reports relief of chest tightness, denies any c/o.

## 2014-03-16 NOTE — ED Provider Notes (Signed)
CSN: [id number]     Arrival date & time 03/16/14  1816 History  This chart was scribed for Rolan Bucco, MD by Luisa Dago, ED Scribe. This patient was seen in room MH02/MH02 and the patient's care was started at 6:27 PM.    Chief Complaint  Patient presents with  . Palpitations    HPI HPI Comments: Jennifer Howard is a 48 y.o. female with hx of SVT presents to the Emergency Department complaining of recurrent sudden onset palpitations that occurred at 1:30 PM today. Pt states that she has not been able to get her medication filled. She was also prescribed beta blockers which she has not been taking regularly, however, she does report taking 2 beta blockers today. Which did not relieve her symptoms after 45 minutes wait period. She is also endorsing associated chest tightness. Pt was last seen at the ED on 8/28 with similar complaint. She was discharged with a prescription for diltiazem for rate control as an alternative to beta-blockers but she has not been able to get this filled due to cost.  She is contacting other pharmacies to see if she can get it filled cheaper.  She has not followed up with cardiology due to lack of insurance. She denies any other pertinent medical history.  Past Medical History  Diagnosis Date  . SVT (supraventricular tachycardia)   . Depression   . Obesity   . Migraine    Past Surgical History  Procedure Laterality Date  . Angioma cautery    . Toe surgery     History reviewed. No pertinent family history. History  Substance Use Topics  . Smoking status: Never Smoker   . Smokeless tobacco: Not on file  . Alcohol Use: No   OB History   Grav Para Term Preterm Abortions TAB SAB Ect Mult Living                 Review of Systems  Constitutional: Positive for fatigue. Negative for fever, chills and diaphoresis.  HENT: Negative for congestion, rhinorrhea and sneezing.   Eyes: Negative.   Respiratory: Negative for cough, chest tightness and shortness of  breath.   Cardiovascular: Positive for palpitations. Negative for chest pain and leg swelling.  Gastrointestinal: Negative for nausea, vomiting, abdominal pain, diarrhea and blood in stool.  Genitourinary: Negative for frequency, hematuria, flank pain and difficulty urinating.  Musculoskeletal: Negative for arthralgias and back pain.  Skin: Negative for rash.  Neurological: Positive for headaches. Negative for dizziness, speech difficulty, weakness and numbness.      Allergies  Iodine and Penicillins  Home Medications   Prior to Admission medications   Medication Sig Start Date End Date Taking? Authorizing Provider  acetaminophen (TYLENOL) 500 MG tablet Take 1,000 mg by mouth every 6 (six) hours as needed. Patient used this medication for pain.    Historical Provider, MD  cloNIDine (CATAPRES) 0.1 MG tablet Take 0.1 mg by mouth at bedtime.    Historical Provider, MD  diltiazem (CARDIZEM CD) 120 MG 24 hr capsule Take 1 capsule (120 mg total) by mouth daily. 02/18/14   Elwin Mocha, MD  DULoxetine (CYMBALTA) 60 MG capsule Take 60 mg by mouth daily.    Historical Provider, MD  fluticasone (FLONASE) 50 MCG/ACT nasal spray Place 2 sprays into the nose daily.    Historical Provider, MD  ibuprofen (ADVIL,MOTRIN) 200 MG tablet Take 600 mg by mouth every 6 (six) hours as needed. Patient used this medication for pain.    Historical Provider, MD  lamoTRIgine (LAMICTAL) 200 MG tablet Take 200 mg by mouth daily.    Historical Provider, MD  metoprolol (LOPRESSOR) 50 MG tablet Take 1 tablet (50 mg total) by mouth as needed (for palpitations). 09/26/13   Gwyneth Sprout, MD  trolamine salicylate (ASPERCREME) 10 % cream Apply topically as needed. Patient used this medication for foot and shoulder pain.    Historical Provider, MD  zolmitriptan (ZOMIG) 5 MG tablet Take 5 mg by mouth as needed. Patient took 2.5 mg of this medication today.    Historical Provider, MD  zolmitriptan (ZOMIG) 5 MG tablet Take 1  tablet (5 mg total) by mouth as needed for migraine. 10/31/12   Lonia Skinner Sofia, PA-C   BP 109/55  Pulse 82  Resp 19  SpO2 100% Physical Exam  Constitutional: She is oriented to person, place, and time. She appears well-developed and well-nourished.  HENT:  Head: Normocephalic and atraumatic.  Eyes: Pupils are equal, round, and reactive to light.  Neck: Normal range of motion. Neck supple.  Cardiovascular: Regular rhythm and normal heart sounds.  Tachycardia present.   Pulmonary/Chest: Effort normal and breath sounds normal. No respiratory distress. She has no wheezes. She has no rales. She exhibits no tenderness.  Abdominal: Soft. Bowel sounds are normal. There is no tenderness. There is no rebound and no guarding.  Musculoskeletal: Normal range of motion. She exhibits no edema.  Lymphadenopathy:    She has no cervical adenopathy.  Neurological: She is alert and oriented to person, place, and time.  Skin: Skin is warm and dry. No rash noted.  Psychiatric: She has a normal mood and affect.    ED Course  Procedures (including critical care time)  COORDINATION OF CARE: 6:31 PM - Critical care performed to stabilize pt.  CRITICAL CARE Performed by:  Rolan Bucco, MD Total critical care time: 30 minutes Critical care time was exclusive of separately billable procedures and treating other patients. Critical care was necessary to treat or prevent imminent or life-threatening deterioration. Critical care was time spent personally by me on the following activities: development of treatment plan with patient and/or surrogate as well as nursing, discussions with consultants, evaluation of patient's response to treatment, examination of patient, obtaining history from patient or surrogate, ordering and performing treatments and interventions, ordering and review of laboratory studies, ordering and review of radiographic studies, pulse oximetry and re-evaluation of patient's condition.  Labs  Review Labs Reviewed - No data to display  Imaging Review No results found.   EKG Interpretation None      Date: 03/16/2014  Rate: 168  Rhythm: supraventricular tachycardia (SVT)  QRS Axis: normal  Intervals: normal  ST/T Wave abnormalities: nonspecific ST/T changes  Conduction Disutrbances:none  Narrative Interpretation:   Old EKG Reviewed: changes noted   Date: 03/16/2014  Rate: 104  Rhythm: sinus tachycardia  QRS Axis: normal  Intervals: normal  ST/T Wave abnormalities: normal  Conduction Disutrbances:none  Narrative Interpretation:   Old EKG Reviewed: unchanged   MDM   Final diagnoses:  SVT (supraventricular tachycardia)    Pt with SVT, failed vagal maneuvers. She was given and adenosine 6 mg IV rapid push. This converted her to a sinus rhythm. She was monitored for a while after that. She was feeling much better. Her RV came down to the 70s. She was ready to go home. Her symptoms are consistent with her past episodes of SVT. She was initially prescribed Lopressor for rate control however she's not taking it due to the weight makes  her feel. She was then given a prescription for Cardizem however she's not taking it due to the cost. I advised her that she needs to start taking one of those 2 medications for rate control. I also stressed the importance of trying to followup with cardiology.  CRITICAL CARE Performed by: Radwan Cowley Total critical care time: 30 Critical care time was exclusive of separately billable procedures and treating other patients. Critical care was necessary to treat or prevent imminent or life-threatening deterioration. Critical care was time spent personally by me on the following activities: development of treatment plan with patient and/or surrogate as well as nursing, discussions with consultants, evaluation of patient's response to treatment, examination of patient, obtaining history from patient or surrogate, ordering and performing  treatments and interventions, ordering and review of laboratory studies, ordering and review of radiographic studies, pulse oximetry and re-evaluation of patient's condition.   I personally performed the services described in this documentation, which was scribed in my presence. The recorded information has been reviewed and is accurate.    Rolan Bucco, MD 03/16/14 2012

## 2014-03-16 NOTE — Discharge Instructions (Signed)
Supraventricular Tachycardia °Supraventricular tachycardia (SVT) is an abnormal heart rhythm (arrhythmia) that causes the heart to beat very fast (tachycardia). This kind of fast heartbeat originates in the upper chambers of the heart (atria). SVT can cause the heart to beat greater than 100 beats per minute. SVT can have a rapid burst of heartbeats. This can start and stop suddenly without warning and is called nonsustained. SVT can also be sustained, in which the heart beats at a continuous fast rate.  °CAUSES  °There can be different causes of SVT. Some of these include: °· Heart valve problems such as mitral valve prolapse. °· An enlarged heart (hypertrophic cardiomyopathy). °· Congenital heart problems. °· Heart inflammation (pericarditis). °· Hyperthyroidism. °· Low potassium or magnesium levels. °· Caffeine. °· Drug use such as cocaine, methamphetamines, or stimulants. °· Some over-the-counter medicines such as: °¨ Decongestants. °¨ Diet medicines. °¨ Herbal medicines. °SYMPTOMS  °Symptoms of SVT can vary. Symptoms depend on whether the SVT is sustained or nonsustained. You may experience: °· No symptoms (asymptomatic). °· An awareness of your heart beating rapidly (palpitations). °· Shortness of breath. °· Chest pain or pressure. °If your blood pressure drops because of the SVT, you may experience: °· Fainting or near fainting. °· Weakness. °· Dizziness. °DIAGNOSIS  °Different tests can be performed to diagnose SVT, such as: °· An electrocardiogram (EKG). This is a painless test that records the electrical activity of your heart. °· Holter monitor. This is a 24 hour recording of your heart rhythm. You will be given a diary. Write down all symptoms that you have and what you were doing at the time you experienced symptoms. °· Arrhythmia monitor. This is a small device that your wear for several weeks. It records the heart rhythm when you have symptoms. °· Echocardiogram. This is an imaging test to help detect  abnormal heart structure such as congenital abnormalities, heart valve problems, or heart enlargement. °· Stress test. This test can help determine if the SVT is related to exercise. °· Electrophysiology study (EPS). This is a procedure that evaluates your heart's electrical system and can help your caregiver find the cause of your SVT. °TREATMENT  °Treatment of SVT depends on the symptoms, how often it recurs, and whether there are any underlying heart problems.  °· If symptoms are rare and no other cardiac disease is present, no treatment may be needed. °· Blood work may be done to check potassium, magnesium, and thyroid hormone levels to see if they are abnormal. If these levels are abnormal, treatment to correct the problems will occur. °Medicines °Your caregiver may use oral medicines to treat SVT. These medicines are given for long-term control of SVT. Medicines may be used alone or in combination with other treatments. These medicines work to slow nerve impulses in the heart muscle. These medicines can also be used to treat high blood pressure. Some of these medicines may include: °· Calcium channel blockers. °· Beta blockers. °· Digoxin. °Nonsurgical procedures °Nonsurgical techniques may be used if oral medicines do not work. Some examples include: °· Cardioversion. This technique uses either drugs or an electrical shock to restore a normal heart rhythm. °¨ Cardioversion drugs may be given through an intravenous (IV) line to help "reset" the heart rhythm. °¨ In electrical cardioversion, the caregiver shocks your heart to stop its beat for a split second. This helps to reset the heart to a normal rhythm. °· Ablation. This procedure is done under mild sedation. High frequency radio wave energy is used to   destroy the area of heart tissue responsible for the SVT. °HOME CARE INSTRUCTIONS  °· Do not smoke. °· Only take medicines prescribed by your caregiver. Check with your caregiver before using over-the-counter  medicines. °· Check with your caregiver about how much alcohol and caffeine (coffee, tea, colas, or chocolate) you may have. °· It is very important to keep all follow-up referrals and appointments in order to properly manage this problem. °SEEK IMMEDIATE MEDICAL CARE IF: °· You have dizziness. °· You faint or nearly faint. °· You have shortness of breath. °· You have chest pain or pressure. °· You have sudden nausea or vomiting. °· You have profuse sweating. °· You are concerned about how long your symptoms last. °· You are concerned about the frequency of your SVT episodes. °If you have the above symptoms, call your local emergency services (911 in [place] immediately. Do not drive yourself to the hospital. °MAKE SURE YOU:  °· Understand these instructions. °· Will watch your condition. °· Will get help right away if you are not doing well or get worse. °Document Released: 06/10/2005 Document Revised: 09/02/2011 Document Reviewed: 09/22/2008 °[employer]® Patient Information ©2015 [employer]. This information is not intended to replace advice given to you by your health care provider. Make sure you discuss any questions you have with your health care provider. ° °

## 2014-03-16 NOTE — ED Notes (Signed)
Pt c/o " heart palpitations" HX SVT also c/o chest pressure x 5 hrs Pt reports no relief with " forceful cough or vagal maneuver "

## 2014-03-16 NOTE — ED Notes (Signed)
MD at bedside. Pt connected to continuous ekg.

## 2014-04-29 ENCOUNTER — Ambulatory Visit (INDEPENDENT_AMBULATORY_CARE_PROVIDER_SITE_OTHER): Payer: BC Managed Care – PPO | Admitting: Cardiology

## 2014-04-29 ENCOUNTER — Encounter: Payer: Self-pay | Admitting: Cardiology

## 2014-04-29 VITALS — BP 130/86 | HR 67 | Ht 63.0 in | Wt 281.0 lb

## 2014-04-29 DIAGNOSIS — I471 Supraventricular tachycardia: Secondary | ICD-10-CM

## 2014-04-29 MED ORDER — DILTIAZEM HCL 120 MG PO TABS: ORAL_TABLET | ORAL | Status: DC

## 2014-04-29 NOTE — Progress Notes (Signed)
Patient ID: Jennifer Howard, female   DOB: May 10, 1966, 48 y.o.   MRN: [id number]      Patient Name: Jennifer Grossrin Goree Date of Encounter: 04/29/2014  Primary Care Provider:  [name] Patient Primary Cardiologist:  Lars MassonNELSON, Perina Salvaggio H  Problem List   Past Medical History  Diagnosis Date  . SVT (supraventricular tachycardia)   . Depression   . Obesity   . Migraine    Past Surgical History  Procedure Laterality Date  . Angioma cautery    . Toe surgery      Allergies  Allergies  Allergen Reactions  . Iodine Nausea And Vomiting    Nausea and vomiting   . Penicillins Other (See Comments)    Childhood Reaction    HPI  Jennifer Grossrin John is a 48 y.o. female with hx of SVT that started in her teenage years. She states that over the last couple years her episodes have been more frequent and more symptomatic and longer lasting. In the past she was able to stop them with Valsalva maneuvers however lately she required multiple visits to the ER.on 03/16/2014 there was an SVT with heart rate of 170 recorded. The patient states that during those episodes she is feeling short of breath dizzy and presyncopal however never had a syncopal episode. She was prescribed beta blockers to causing her significant fatigue. She is also very concerned about financial cost as she is currently working [employer] and doesn't have health insurance. She denies any chest pain or shortness of breath when not having her arrhythmia also denies lower extremity edema orthopnea or proximal nocturnal dyspnea.   Home Medications  Prior to Admission medications   Medication Sig Start Date End Date Taking? Authorizing Provider  acetaminophen (TYLENOL) 500 MG tablet Take 1,000 mg by mouth every 6 (six) hours as needed. Patient used this medication for pain.   Yes Historical Provider, MD  DULoxetine (CYMBALTA) 60 MG capsule Take 120 mg by mouth daily.    Yes Historical Provider, MD  fluticasone (FLONASE) 50 MCG/ACT nasal spray Place 2  sprays into the nose daily.   Yes Historical Provider, MD  ibuprofen (ADVIL,MOTRIN) 200 MG tablet Take 600 mg by mouth every 6 (six) hours as needed. Patient used this medication for pain.   Yes Historical Provider, MD  lamoTRIgine (LAMICTAL) 200 MG tablet Take 200 mg by mouth daily.   Yes Historical Provider, MD  trolamine salicylate (ASPERCREME) 10 % cream Apply topically as needed. Patient used this medication for foot and shoulder pain.   Yes Historical Provider, MD  zolmitriptan (ZOMIG) 5 MG tablet Take 5 mg by mouth as needed. Patient took 2.5 mg of this medication today.   Yes Historical Provider, MD    Family History  Family History  Problem Relation Age of Onset  . Diabetes Mother   . Cancer Mother     bladder  . Heart attack Father   . Heart disease Father   . Urolithiasis Father     Social History  History   Social History  . Marital Status: Single    Spouse Name: N/A    Number of Children: N/A  . Years of Education: N/A   Occupational History  . Not on file.   Social History Main Topics  . Smoking status: Never Smoker   . Smokeless tobacco: Not on file  . Alcohol Use: No  . Drug Use: No  . Sexual Activity: No   Other Topics Concern  . Not on file  Social History Narrative     Review of Systems, as per HPI, otherwise negative General:  No chills, fever, night sweats or weight changes.  Cardiovascular:  No chest pain, dyspnea on exertion, edema, orthopnea, palpitations, paroxysmal nocturnal dyspnea. Dermatological: No rash, lesions/masses Respiratory: No cough, dyspnea Urologic: No hematuria, dysuria Abdominal:   No nausea, vomiting, diarrhea, bright red blood per rectum, melena, or hematemesis Neurologic:  No visual changes, wkns, changes in mental status. All other systems reviewed and are otherwise negative except as noted above.  Physical Exam  Blood pressure 130/86, pulse 67, height 5\' 3"  (1.6 m), weight 281 lb (127.461 kg).  General:  Pleasant, NAD Psych: Normal affect. Neuro: Alert and oriented X 3. Moves all extremities spontaneously. HEENT: Normal  Neck: Supple without bruits or JVD. Lungs:  Resp regular and unlabored, CTA. Heart: RRR no s3, s4, or murmurs. Abdomen: Soft, non-tender, non-distended, BS + x 4.  Extremities: No clubbing, cyanosis or edema. DP/PT/Radials 2+ and equal bilaterally.  Labs:  No results for input(s): CKTOTAL, CKMB, TROPONINI in the last 72 hours. Lab Results  Component Value Date   WBC 15.4* 02/18/2014   HGB 13.9 02/18/2014   HCT 42.1 02/18/2014   MCV 85.2 02/18/2014   PLT 410* 02/18/2014    No results found for: DDIMER Invalid input(s): POCBNP    Component Value Date/Time   NA 141 02/18/2014 2052   K 3.8 02/18/2014 2052   CL 101 02/18/2014 2052   CO2 24 02/18/2014 2052   GLUCOSE 120* 02/18/2014 2052   BUN 12 02/18/2014 2052   CREATININE 0.90 02/18/2014 2052   CALCIUM 10.1 02/18/2014 2052   GFRNONAA 76* 02/18/2014 2052   GFRAA 88* 02/18/2014 2052   No results found for: CHOL, HDL, LDLCALC, TRIG  Accessory Clinical Findings  Echocardiogram - 11/26/2013, Normal LV size, wall thickness and systolic function. LVEF 60-65%. Left atrium is moderately dilated The right ventricle is normal in size and function Aortic valve is normal, mitral valve is normal, tricuspid valve is normal, everything else is normal.  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 Her echocardiogram is normal other than moderately dilated left atrium. We will refer her to electrophysiology as I believe the best option for her would be to undergo an ablation. We will  apply for hardship as she doesn't have insurance right now.    Lars MassonNELSON, Ana Woodroof H, MD, [facility] CenterFACC 04/29/2014, 3:45 PM

## 2014-04-29 NOTE — Patient Instructions (Signed)
Your physician has recommended you make the following change in your medication:   DR NELSON HAS PRESCRIBED FOR YOU TO TAKE DILTIAZEM 120 MG- TAKE ONE TABLET AS NEEDED FOR SVT   You have been referred to ONE OF OUR EP DOCTORS   Your physician wants you to follow-up in: 6 MONTHS WITH DR Johnell ComingsNELSON You will receive a reminder letter in the mail two months in advance. If you don't receive a letter, please call our office to schedule the follow-up appointment.

## 2014-05-16 ENCOUNTER — Emergency Department (HOSPITAL_BASED_OUTPATIENT_CLINIC_OR_DEPARTMENT_OTHER)
Admission: EM | Admit: 2014-05-16 | Discharge: 2014-05-16 | Disposition: A | Discharge status: Home / Self Care | Payer: Self-pay | Attending: Emergency Medicine | Admitting: Emergency Medicine

## 2014-05-16 ENCOUNTER — Encounter (HOSPITAL_BASED_OUTPATIENT_CLINIC_OR_DEPARTMENT_OTHER): Payer: Self-pay | Admitting: *Deleted

## 2014-05-16 DIAGNOSIS — Z7951 Long term (current) use of inhaled steroids: Secondary | ICD-10-CM | POA: Insufficient documentation

## 2014-05-16 DIAGNOSIS — Z79899 Other long term (current) drug therapy: Secondary | ICD-10-CM | POA: Insufficient documentation

## 2014-05-16 DIAGNOSIS — E669 Obesity, unspecified: Secondary | ICD-10-CM | POA: Insufficient documentation

## 2014-05-16 DIAGNOSIS — Z880 Allergy status to penicillin: Secondary | ICD-10-CM | POA: Insufficient documentation

## 2014-05-16 DIAGNOSIS — F329 Major depressive disorder, single episode, unspecified: Secondary | ICD-10-CM | POA: Insufficient documentation

## 2014-05-16 DIAGNOSIS — G43909 Migraine, unspecified, not intractable, without status migrainosus: Secondary | ICD-10-CM | POA: Insufficient documentation

## 2014-05-16 DIAGNOSIS — I471 Supraventricular tachycardia: Secondary | ICD-10-CM | POA: Insufficient documentation

## 2014-05-16 MED ORDER — ADENOSINE 6 MG/2ML IV SOLN
INTRAVENOUS | Status: AC
  Administered 2014-05-16: 6 mg
  Filled 2014-05-16: qty 6

## 2014-05-16 MED ORDER — ACETAMINOPHEN 325 MG PO TABS
650.0000 mg | ORAL_TABLET | Freq: Once | ORAL | Status: AC
  Administered 2014-05-16: 650 mg via ORAL
  Filled 2014-05-16: qty 2

## 2014-05-16 NOTE — ED Notes (Signed)
C/o rapid hrt rate since 0730 am. States hx of SVT. Has tried vagal maneuvers without success. States she also feels hot but not sweaty. No c/p but did have have tightness in mid chest.

## 2014-05-16 NOTE — ED Provider Notes (Signed)
CSN: [id number]     Arrival date & time 05/16/14  [id number] History   First MD Initiated Contact with Patient 05/16/14 0932     Chief Complaint  Patient presents with  . Tachycardia     (Consider location/radiation/quality/duration/timing/severity/associated sxs/prior Treatment) HPI Comments: Patient presents to the ER for evaluation of rapid heartbeat. Patient reports a previous history of multiple episodes of SVT with similar symptoms. She has not been expressing any chest pain, no passing out. Patient has tried vagal maneuvers. Patient reports taking Cardizem 120 mg tablet 2 prior to arrival in the ER without improvement.  She reports that she has been seen by cardiology, is pending follow-up with Dr. Ladona Ridgelaylor, EP specialist.   Past Medical History  Diagnosis Date  . SVT (supraventricular tachycardia)   . Depression   . Obesity   . Migraine    Past Surgical History  Procedure Laterality Date  . Angioma cautery    . Toe surgery     Family History  Problem Relation Age of Onset  . Diabetes Mother   . Cancer Mother     bladder  . Heart attack Father   . Heart disease Father   . Urolithiasis Father    History  Substance Use Topics  . Smoking status: Never Smoker   . Smokeless tobacco: Not on file  . Alcohol Use: No   OB History    No data available     Review of Systems  Cardiovascular: Positive for palpitations.  All other systems reviewed and are negative.     Allergies  Iodine and Penicillins  Home Medications   Prior to Admission medications   Medication Sig Start Date End Date Taking? Authorizing Provider  acetaminophen (TYLENOL) 500 MG tablet Take 1,000 mg by mouth every 6 (six) hours as needed. Patient used this medication for pain.   Yes Historical Provider, MD  diltiazem (CARDIZEM) 120 MG tablet Take one tablet as needed for SVT 04/29/14  Yes Lars MassonKatarina H Nelson, MD  DULoxetine (CYMBALTA) 60 MG capsule Take 120 mg by mouth daily.    Yes Historical  Provider, MD  fluticasone (FLONASE) 50 MCG/ACT nasal spray Place 2 sprays into the nose daily.   Yes Historical Provider, MD  ibuprofen (ADVIL,MOTRIN) 200 MG tablet Take 600 mg by mouth every 6 (six) hours as needed. Patient used this medication for pain.   Yes Historical Provider, MD  lamoTRIgine (LAMICTAL) 200 MG tablet Take 200 mg by mouth daily.   Yes Historical Provider, MD  trolamine salicylate (ASPERCREME) 10 % cream Apply topically as needed. Patient used this medication for foot and shoulder pain.   Yes Historical Provider, MD  zolmitriptan (ZOMIG) 5 MG tablet Take 5 mg by mouth as needed. Patient took 2.5 mg of this medication today.   Yes Historical Provider, MD   BP 117/77 mmHg  Pulse 83  Temp(Src) 98.3 F (36.8 C) (Oral)  Resp 18  Ht 5\' 4"  (1.626 m)  Wt 280 lb (127.007 kg)  BMI 48.04 kg/m2  SpO2 96% Physical Exam  Constitutional: She is oriented to person, place, and time. She appears well-developed and well-nourished. No distress.  HENT:  Head: Normocephalic and atraumatic.  Right Ear: Hearing normal.  Left Ear: Hearing normal.  Nose: Nose normal.  Mouth/Throat: Oropharynx is clear and moist and mucous membranes are normal.  Eyes: Conjunctivae and EOM are normal. Pupils are equal, round, and reactive to light.  Neck: Normal range of motion. Neck supple.  Cardiovascular: Regular rhythm, S1 normal  and S2 normal.  Tachycardia present.  Exam reveals no gallop and no friction rub.   No murmur heard. Pulmonary/Chest: Effort normal and breath sounds normal. No respiratory distress. She exhibits no tenderness.  Abdominal: Soft. Normal appearance and bowel sounds are normal. There is no hepatosplenomegaly. There is no tenderness. There is no rebound, no guarding, no tenderness at McBurney's point and negative Murphy's sign. No hernia.  Musculoskeletal: Normal range of motion.  Neurological: She is alert and oriented to person, place, and time. She has normal strength. No cranial  nerve deficit or sensory deficit. Coordination normal. GCS eye subscore is 4. GCS verbal subscore is 5. GCS motor subscore is 6.  Skin: Skin is warm, dry and intact. No rash noted. No cyanosis.  Psychiatric: She has a normal mood and affect. Her speech is normal and behavior is normal. Thought content normal.  Nursing note and vitals reviewed.   ED Course  Procedures (including critical care time) Labs Review Labs Reviewed - No data to display  Imaging Review No results found.   EKG Interpretation #1   Date/Time:  Monday May 16 2014 09:23:11 EST Ventricular Rate:  166 PR Interval:    QRS Duration: 96 QT Interval:  276 QTC Calculation: 458 R Axis:   60 Text Interpretation:  Supraventricular tachycardia Incomplete right bundle  branch block Nonspecific ST abnormality Abnormal ECG Confirmed by Kyng Matlock   MD, Cerissa Zeiger ([id number]) on 05/16/2014 9:53:32 AM    EKG Interpretation #2 (post-adenosine)  Date/Time:  Monday May 16 2014 09:33:53 EST Ventricular Rate:  97 PR Interval:  174 QRS Duration: 84 QT Interval:  342 QTC Calculation: 434 R Axis:   57 Text Interpretation:  Normal sinus rhythm Possible Left atrial enlargement Borderline ECG Confirmed by Yomara Toothman  MD, Kerron Sedano ([id number]) on 05/16/2014 9:55:41 AM        MDM   Final diagnoses:  SVT (supraventricular tachycardia)    Patient presented to the ER for evaluation of rapid heartbeat. Patient has a history of recurrent SVT. Patient has been seen in the ER for this multiple times in the past. Symptoms have been paroxysmal, she has cardiology follow-up pending. Patient was administered adenosine 6 mg IV push with conversion to normal sinus rhythm. Postconversion EKG is unremarkable. No lab work is necessary, as patient has had similar presentation many times previously. Patient monitored in the ER for a period of time after cardioversion with no recurrence of SVT. Patient discharged, will follow-up with  cardiology.    Gilda Creasehristopher J. Damika Harmon, MD 05/16/14 [phone]

## 2014-05-16 NOTE — ED Notes (Signed)
Pt states feels much better.

## 2014-05-16 NOTE — Discharge Instructions (Signed)
Supraventricular Tachycardia °Supraventricular tachycardia (SVT) is an abnormal heart rhythm (arrhythmia) that causes the heart to beat very fast (tachycardia). This kind of fast heartbeat originates in the upper chambers of the heart (atria). SVT can cause the heart to beat greater than 100 beats per minute. SVT can have a rapid burst of heartbeats. This can start and stop suddenly without warning and is called nonsustained. SVT can also be sustained, in which the heart beats at a continuous fast rate.  °CAUSES  °There can be different causes of SVT. Some of these include: °· Heart valve problems such as mitral valve prolapse. °· An enlarged heart (hypertrophic cardiomyopathy). °· Congenital heart problems. °· Heart inflammation (pericarditis). °· Hyperthyroidism. °· Low potassium or magnesium levels. °· Caffeine. °· Drug use such as cocaine, methamphetamines, or stimulants. °· Some over-the-counter medicines such as: °¨ Decongestants. °¨ Diet medicines. °¨ Herbal medicines. °SYMPTOMS  °Symptoms of SVT can vary. Symptoms depend on whether the SVT is sustained or nonsustained. You may experience: °· No symptoms (asymptomatic). °· An awareness of your heart beating rapidly (palpitations). °· Shortness of breath. °· Chest pain or pressure. °If your blood pressure drops because of the SVT, you may experience: °· Fainting or near fainting. °· Weakness. °· Dizziness. °DIAGNOSIS  °Different tests can be performed to diagnose SVT, such as: °· An electrocardiogram (EKG). This is a painless test that records the electrical activity of your heart. °· Holter monitor. This is a 24 hour recording of your heart rhythm. You will be given a diary. Write down all symptoms that you have and what you were doing at the time you experienced symptoms. °· Arrhythmia monitor. This is a small device that your wear for several weeks. It records the heart rhythm when you have symptoms. °· Echocardiogram. This is an imaging test to help detect  abnormal heart structure such as congenital abnormalities, heart valve problems, or heart enlargement. °· Stress test. This test can help determine if the SVT is related to exercise. °· Electrophysiology study (EPS). This is a procedure that evaluates your heart's electrical system and can help your caregiver find the cause of your SVT. °TREATMENT  °Treatment of SVT depends on the symptoms, how often it recurs, and whether there are any underlying heart problems.  °· If symptoms are rare and no other cardiac disease is present, no treatment may be needed. °· Blood work may be done to check potassium, magnesium, and thyroid hormone levels to see if they are abnormal. If these levels are abnormal, treatment to correct the problems will occur. °Medicines °Your caregiver may use oral medicines to treat SVT. These medicines are given for long-term control of SVT. Medicines may be used alone or in combination with other treatments. These medicines work to slow nerve impulses in the heart muscle. These medicines can also be used to treat high blood pressure. Some of these medicines may include: °· Calcium channel blockers. °· Beta blockers. °· Digoxin. °Nonsurgical procedures °Nonsurgical techniques may be used if oral medicines do not work. Some examples include: °· Cardioversion. This technique uses either drugs or an electrical shock to restore a normal heart rhythm. °¨ Cardioversion drugs may be given through an intravenous (IV) line to help "reset" the heart rhythm. °¨ In electrical cardioversion, the caregiver shocks your heart to stop its beat for a split second. This helps to reset the heart to a normal rhythm. °· Ablation. This procedure is done under mild sedation. High frequency radio wave energy is used to   destroy the area of heart tissue responsible for the SVT. °HOME CARE INSTRUCTIONS  °· Do not smoke. °· Only take medicines prescribed by your caregiver. Check with your caregiver before using over-the-counter  medicines. °· Check with your caregiver about how much alcohol and caffeine (coffee, tea, colas, or chocolate) you may have. °· It is very important to keep all follow-up referrals and appointments in order to properly manage this problem. °SEEK IMMEDIATE MEDICAL CARE IF: °· You have dizziness. °· You faint or nearly faint. °· You have shortness of breath. °· You have chest pain or pressure. °· You have sudden nausea or vomiting. °· You have profuse sweating. °· You are concerned about how long your symptoms last. °· You are concerned about the frequency of your SVT episodes. °If you have the above symptoms, call your local emergency services (911 in [place] immediately. Do not drive yourself to the hospital. °MAKE SURE YOU:  °· Understand these instructions. °· Will watch your condition. °· Will get help right away if you are not doing well or get worse. °Document Released: 06/10/2005 Document Revised: 09/02/2011 Document Reviewed: 09/22/2008 °[employer]® Patient Information ©2015 [employer]. This information is not intended to replace advice given to you by your health care provider. Make sure you discuss any questions you have with your health care provider. ° °

## 2014-05-26 ENCOUNTER — Institutional Professional Consult (permissible substitution): Payer: Self-pay | Admitting: Internal Medicine

## 2014-06-01 ENCOUNTER — Institutional Professional Consult (permissible substitution): Payer: Self-pay | Admitting: Internal Medicine

## 2014-06-12 ENCOUNTER — Encounter (HOSPITAL_BASED_OUTPATIENT_CLINIC_OR_DEPARTMENT_OTHER): Payer: Self-pay | Admitting: *Deleted

## 2014-06-12 ENCOUNTER — Emergency Department (HOSPITAL_BASED_OUTPATIENT_CLINIC_OR_DEPARTMENT_OTHER)
Admission: EM | Admit: 2014-06-12 | Discharge: 2014-06-12 | Disposition: A | Discharge status: Home / Self Care | Payer: Self-pay | Attending: Emergency Medicine | Admitting: Emergency Medicine

## 2014-06-12 DIAGNOSIS — Z880 Allergy status to penicillin: Secondary | ICD-10-CM | POA: Insufficient documentation

## 2014-06-12 DIAGNOSIS — E669 Obesity, unspecified: Secondary | ICD-10-CM | POA: Insufficient documentation

## 2014-06-12 DIAGNOSIS — Z79899 Other long term (current) drug therapy: Secondary | ICD-10-CM | POA: Insufficient documentation

## 2014-06-12 DIAGNOSIS — G44019 Episodic cluster headache, not intractable: Secondary | ICD-10-CM | POA: Insufficient documentation

## 2014-06-12 DIAGNOSIS — Z7951 Long term (current) use of inhaled steroids: Secondary | ICD-10-CM | POA: Insufficient documentation

## 2014-06-12 DIAGNOSIS — R5383 Other fatigue: Secondary | ICD-10-CM | POA: Insufficient documentation

## 2014-06-12 DIAGNOSIS — I471 Supraventricular tachycardia: Secondary | ICD-10-CM | POA: Insufficient documentation

## 2014-06-12 MED ORDER — ACYCLOVIR 400 MG PO TABS: 400.0000 mg | ORAL_TABLET | Freq: Four times a day (QID) | ORAL | Status: DC

## 2014-06-12 MED ORDER — PROMETHAZINE HCL 25 MG/ML IJ SOLN
25.0000 mg | Freq: Once | INTRAMUSCULAR | Status: AC
  Administered 2014-06-12: 25 mg via INTRAVENOUS
  Filled 2014-06-12: qty 1

## 2014-06-12 MED ORDER — SODIUM CHLORIDE 0.9 % IV BOLUS (SEPSIS)
500.0000 mL | Freq: Once | INTRAVENOUS | Status: AC
  Administered 2014-06-12: 500 mL via INTRAVENOUS

## 2014-06-12 MED ORDER — ACETAMINOPHEN 500 MG PO TABS
1000.0000 mg | ORAL_TABLET | Freq: Once | ORAL | Status: AC
  Administered 2014-06-12: 1000 mg via ORAL
  Filled 2014-06-12: qty 2

## 2014-06-12 MED ORDER — ADENOSINE 6 MG/2ML IV SOLN: 6.0000 mg | Freq: Once | INTRAVENOUS | Status: DC

## 2014-06-12 MED ORDER — ADENOSINE 6 MG/2ML IV SOLN
INTRAVENOUS | Status: AC
  Filled 2014-06-12: qty 4

## 2014-06-12 NOTE — Discharge Instructions (Signed)
Supraventricular Tachycardia °Supraventricular tachycardia (SVT) is an abnormal heart rhythm (arrhythmia) that causes the heart to beat very fast (tachycardia). This kind of fast heartbeat originates in the upper chambers of the heart (atria). SVT can cause the heart to beat greater than 100 beats per minute. SVT can have a rapid burst of heartbeats. This can start and stop suddenly without warning and is called nonsustained. SVT can also be sustained, in which the heart beats at a continuous fast rate.  °CAUSES  °There can be different causes of SVT. Some of these include: °· Heart valve problems such as mitral valve prolapse. °· An enlarged heart (hypertrophic cardiomyopathy). °· Congenital heart problems. °· Heart inflammation (pericarditis). °· Hyperthyroidism. °· Low potassium or magnesium levels. °· Caffeine. °· Drug use such as cocaine, methamphetamines, or stimulants. °· Some over-the-counter medicines such as: °¨ Decongestants. °¨ Diet medicines. °¨ Herbal medicines. °SYMPTOMS  °Symptoms of SVT can vary. Symptoms depend on whether the SVT is sustained or nonsustained. You may experience: °· No symptoms (asymptomatic). °· An awareness of your heart beating rapidly (palpitations). °· Shortness of breath. °· Chest pain or pressure. °If your blood pressure drops because of the SVT, you may experience: °· Fainting or near fainting. °· Weakness. °· Dizziness. °DIAGNOSIS  °Different tests can be performed to diagnose SVT, such as: °· An electrocardiogram (EKG). This is a painless test that records the electrical activity of your heart. °· Holter monitor. This is a 24 hour recording of your heart rhythm. You will be given a diary. Write down all symptoms that you have and what you were doing at the time you experienced symptoms. °· Arrhythmia monitor. This is a small device that your wear for several weeks. It records the heart rhythm when you have symptoms. °· Echocardiogram. This is an imaging test to help detect  abnormal heart structure such as congenital abnormalities, heart valve problems, or heart enlargement. °· Stress test. This test can help determine if the SVT is related to exercise. °· Electrophysiology study (EPS). This is a procedure that evaluates your heart's electrical system and can help your caregiver find the cause of your SVT. °TREATMENT  °Treatment of SVT depends on the symptoms, how often it recurs, and whether there are any underlying heart problems.  °· If symptoms are rare and no other cardiac disease is present, no treatment may be needed. °· Blood work may be done to check potassium, magnesium, and thyroid hormone levels to see if they are abnormal. If these levels are abnormal, treatment to correct the problems will occur. °Medicines °Your caregiver may use oral medicines to treat SVT. These medicines are given for long-term control of SVT. Medicines may be used alone or in combination with other treatments. These medicines work to slow nerve impulses in the heart muscle. These medicines can also be used to treat high blood pressure. Some of these medicines may include: °· Calcium channel blockers. °· Beta blockers. °· Digoxin. °Nonsurgical procedures °Nonsurgical techniques may be used if oral medicines do not work. Some examples include: °· Cardioversion. This technique uses either drugs or an electrical shock to restore a normal heart rhythm. °¨ Cardioversion drugs may be given through an intravenous (IV) line to help "reset" the heart rhythm. °¨ In electrical cardioversion, the caregiver shocks your heart to stop its beat for a split second. This helps to reset the heart to a normal rhythm. °· Ablation. This procedure is done under mild sedation. High frequency radio wave energy is used to   destroy the area of heart tissue responsible for the SVT. °HOME CARE INSTRUCTIONS  °· Do not smoke. °· Only take medicines prescribed by your caregiver. Check with your caregiver before using over-the-counter  medicines. °· Check with your caregiver about how much alcohol and caffeine (coffee, tea, colas, or chocolate) you may have. °· It is very important to keep all follow-up referrals and appointments in order to properly manage this problem. °SEEK IMMEDIATE MEDICAL CARE IF: °· You have dizziness. °· You faint or nearly faint. °· You have shortness of breath. °· You have chest pain or pressure. °· You have sudden nausea or vomiting. °· You have profuse sweating. °· You are concerned about how long your symptoms last. °· You are concerned about the frequency of your SVT episodes. °If you have the above symptoms, call your local emergency services (911 in [place] immediately. Do not drive yourself to the hospital. °MAKE SURE YOU:  °· Understand these instructions. °· Will watch your condition. °· Will get help right away if you are not doing well or get worse. °Document Released: 06/10/2005 Document Revised: 09/02/2011 Document Reviewed: 09/22/2008 °[employer]® Patient Information ©2015 [employer]. This information is not intended to replace advice given to you by your health care provider. Make sure you discuss any questions you have with your health care provider. ° °

## 2014-06-12 NOTE — ED Notes (Signed)
Pt c/o Palpations x 3 hrs. HX of same

## 2014-06-12 NOTE — ED Provider Notes (Addendum)
CSN: [id number]     Arrival date & time 06/12/14  2005 History  This chart was scribed for Jennifer BuccoMelanie Mikhaila Roh, MD by Haywood PaoNadim Abu Hashem, ED Scribe. The patient was seen in MH04/MH04 and the patient's care was started at 8:19 PM.  Chief Complaint  Patient presents with  . Palpitations   Patient is a 48 y.o. female presenting with palpitations. The history is provided by the patient. No language interpreter was used.  Palpitations Associated symptoms: no back pain, no chest pain, no cough, no diaphoresis, no dizziness, no nausea, no numbness, no shortness of breath and no vomiting     HPI Comments: Jennifer Grossrin Howard is a 48 y.o. female with a history of SVT who presents to the Emergency Department complaining of palpations onset today at 5:00 PM. She went to work for 3 hours then came here. Pt has tried Cardizem, bearing down and crunching on the floor for no relief. Pt has weakness and a HA as associated symptoms. Pt does not want the ablation because she was allergic to the iodine dye and pt was awake during the procedure. She denies CP and SOB. [place] in [place] with Thornton PapasLa Bauer.  Past Medical History  Diagnosis Date  . SVT (supraventricular tachycardia)   . Depression   . Obesity   . Migraine    Past Surgical History  Procedure Laterality Date  . Angioma cautery    . Toe surgery     Family History  Problem Relation Age of Onset  . Diabetes Mother   . Cancer Mother     bladder  . Heart attack Father   . Heart disease Father   . Urolithiasis Father    History  Substance Use Topics  . Smoking status: Never Smoker   . Smokeless tobacco: Not on file  . Alcohol Use: No   OB History    No data available     Review of Systems  Constitutional: Positive for fatigue. Negative for fever, chills and diaphoresis.  HENT: Negative for congestion, rhinorrhea and sneezing.   Eyes: Negative.   Respiratory: Negative for cough, chest tightness and shortness  of breath.   Cardiovascular: Positive for palpitations. Negative for chest pain and leg swelling.  Gastrointestinal: Negative for nausea, vomiting, abdominal pain, diarrhea and blood in stool.  Genitourinary: Negative for frequency, hematuria, flank pain and difficulty urinating.  Musculoskeletal: Negative for back pain and arthralgias.  Skin: Negative for rash.  Neurological: Positive for weakness and headaches. Negative for dizziness, speech difficulty and numbness.      Allergies  Iodine and Penicillins  Home Medications   Prior to Admission medications   Medication Sig Start Date End Date Taking? Authorizing Provider  diltiazem (CARDIZEM) 120 MG tablet Take one tablet as needed for SVT 04/29/14  Yes Lars MassonKatarina H Nelson, MD  acetaminophen (TYLENOL) 500 MG tablet Take 1,000 mg by mouth every 6 (six) hours as needed. Patient used this medication for pain.    Historical Provider, MD  DULoxetine (CYMBALTA) 60 MG capsule Take 120 mg by mouth daily.     Historical Provider, MD  fluticasone (FLONASE) 50 MCG/ACT nasal spray Place 2 sprays into the nose daily.    Historical Provider, MD  ibuprofen (ADVIL,MOTRIN) 200 MG tablet Take 600 mg by mouth every 6 (six) hours as needed. Patient used this medication for pain.    Historical Provider, MD  lamoTRIgine (LAMICTAL) 200 MG tablet Take 200 mg by mouth daily.  Historical Provider, MD  trolamine salicylate (ASPERCREME) 10 % cream Apply topically as needed. Patient used this medication for foot and shoulder pain.    Historical Provider, MD  zolmitriptan (ZOMIG) 5 MG tablet Take 5 mg by mouth as needed. Patient took 2.5 mg of this medication today.    Historical Provider, MD   BP 97/61 mmHg  Pulse 70  Temp(Src) 97.7 F (36.5 C)  Resp 19  Ht 5\' 4"  (1.626 m)  Wt 280 lb (127.007 kg)  BMI 48.04 kg/m2  SpO2 97%  LMP 06/12/2014 Physical Exam  Constitutional: She is oriented to person, place, and time. She appears well-developed and well-nourished.   HENT:  Head: Normocephalic and atraumatic.  Eyes: Pupils are equal, round, and reactive to light.  Neck: Normal range of motion. Neck supple.  Cardiovascular: Regular rhythm and normal heart sounds.  Tachycardia present.   Pulmonary/Chest: Effort normal and breath sounds normal. No respiratory distress. She has no wheezes. She has no rales. She exhibits no tenderness.  Abdominal: Soft. Bowel sounds are normal. There is no tenderness. There is no rebound and no guarding.  Musculoskeletal: Normal range of motion. She exhibits no edema.  Lymphadenopathy:    She has no cervical adenopathy.  Neurological: She is alert and oriented to person, place, and time.  Skin: Skin is warm and dry. No rash noted.  Psychiatric: She has a normal mood and affect.    ED Course  Procedures  DIAGNOSTIC STUDIES: Oxygen Saturation is 100% on room air, normal by my interpretation.    COORDINATION OF CARE: 8:28 PM Discussed treatment plan with pt at bedside and pt agreed to plan.  Labs Review Labs Reviewed - No data to display  Imaging Review No results found.   EKG Interpretation   Date/Time:  Sunday June 12 2014 20:15:38 EST Ventricular Rate:  164 PR Interval:    QRS Duration: 102 QT Interval:  278 QTC Calculation: 459 R Axis:   39 Text Interpretation:  Supraventricular tachycardia Nonspecific ST  abnormality Abnormal ECG Confirmed by Deandria Klute  MD, Atzel Mccambridge ([id number]) on  06/12/2014 10:08:39 PM      MDM   Final diagnoses:  SVT (supraventricular tachycardia)  Episodic cluster headache, not intractable    Pt presents in SVT.  We were about to give adenosine and she converted spontaneously to a sinus rhythm.  Her headache was treated with tylenol and phenergan.  She is feeling back to baseline.  A f/u EKG looks ok.  Has appt to f/u with Dr. Ladona Ridgelaylor with cards.      Jennifer BuccoMelanie Zaylie Gisler, MD 06/12/14 [id number]  Jennifer BuccoMelanie Bassel Gaskill, MD 06/12/14 [phone]

## 2014-06-12 NOTE — ED Notes (Signed)
Pt converted to NSR (did not receive Adenosine). Dr. Fredderick PhenixBelfi at bedside. Danna HeftyGolden, Iesha Summerhill Lee, RN

## 2014-07-21 ENCOUNTER — Institutional Professional Consult (permissible substitution): Payer: Self-pay | Admitting: Internal Medicine

## 2014-08-11 ENCOUNTER — Institutional Professional Consult (permissible substitution): Payer: Self-pay | Admitting: Internal Medicine

## 2014-08-24 ENCOUNTER — Institutional Professional Consult (permissible substitution): Payer: Self-pay | Admitting: Internal Medicine

## 2014-10-14 ENCOUNTER — Ambulatory Visit (INDEPENDENT_AMBULATORY_CARE_PROVIDER_SITE_OTHER): Payer: Self-pay | Admitting: Internal Medicine

## 2014-10-14 ENCOUNTER — Encounter: Payer: Self-pay | Admitting: Internal Medicine

## 2014-10-14 VITALS — BP 122/82 | HR 77 | Ht 64.0 in | Wt 280.4 lb

## 2014-10-14 DIAGNOSIS — I471 Supraventricular tachycardia: Secondary | ICD-10-CM

## 2014-10-14 NOTE — Assessment & Plan Note (Signed)
The patient has recurrent and medically refractory SVT. I have discussed the treatment options with the patient and her mother. The risks/benefits/goals/expectations of EP study and catheter ablation were reviewed and she will call us if she wishes to proceed with catheter ablation.

## 2014-10-14 NOTE — Patient Instructions (Signed)
Medication Instructions:  Your physician recommends that you continue on your current medications as directed. Please refer to the Current Medication list given to you today.   Labwork: None ordered   Testing/Procedures: None ordered   Follow-Up: Your physician recommends that you schedule a follow-up appointment as needed    Any Other Special Instructions Will Be Listed Below (If Applicable).   

## 2014-10-14 NOTE — Assessment & Plan Note (Signed)
She has gained over a hundred pounds. She would like to lose weight but has been unable to exercise. Hopefully if she can undergo ablation she can start back exercising.

## 2014-10-14 NOTE — Progress Notes (Signed)
HPI [name] is referred today for evaluation of SVT. She is a pleasant but obese 49 yo woman with a h/o SVT dating back over 25 years. The patient notes that her episodes have increased in frequency and severity and can last up to 60 min. She can usually make them stop with valsalva maneuvers. She has never had syncope but does note sob with her episodes and chest pressure. She has tried multiple calcium channel blockers and beta blockers with no improvement. She has noted fatigue on both of these medications. She has had trouble exercising over the years and has gained weight. She lives with her mother. Allergies  Allergen Reactions  . Iodine Nausea And Vomiting    Nausea and vomiting   . Penicillins Other (See Comments)    Childhood Reaction     Current Outpatient Prescriptions  Medication Sig Dispense Refill  . acetaminophen (TYLENOL) 500 MG tablet Take 1,000 mg by mouth every 6 (six) hours as needed. Patient used this medication for pain.    Marland Kitchen. diltiazem (CARDIZEM) 120 MG tablet Take 120 mg by mouth as needed (for SVT).    . DULoxetine (CYMBALTA) 60 MG capsule Take 120 mg by mouth daily.     . fluticasone (FLONASE) 50 MCG/ACT nasal spray Place 2 sprays into the nose daily.    Marland Kitchen. ibuprofen (ADVIL,MOTRIN) 200 MG tablet Take 600 mg by mouth every 6 (six) hours as needed. Patient used this medication for pain.    Marland Kitchen. lamoTRIgine (LAMICTAL) 200 MG tablet Take 200 mg by mouth daily.    Marland Kitchen. trolamine salicylate (ASPERCREME) 10 % cream Apply topically as needed. Patient used this medication for foot and shoulder pain.    Marland Kitchen. zolmitriptan (ZOMIG) 5 MG tablet Take 5 mg by mouth as needed. Patient took 2.5 mg of this medication today.     No current facility-administered medications for this visit.     Past Medical History  Diagnosis Date  . SVT (supraventricular tachycardia)   . Depression   . Obesity   . Migraine     ROS:   All systems reviewed and negative except as noted in the  HPI.   Past Surgical History  Procedure Laterality Date  . Angioma cautery    . Toe surgery       Family History  Problem Relation Age of Onset  . Diabetes Mother   . Cancer Mother     bladder  . Heart attack Father   . Heart disease Father   . Urolithiasis Father      History   Social History  . Marital Status: Single    Spouse Name: N/A  . Number of Children: N/A  . Years of Education: N/A   Occupational History  . Not on file.   Social History Main Topics  . Smoking status: Never Smoker   . Smokeless tobacco: Not on file  . Alcohol Use: No  . Drug Use: No  . Sexual Activity: No   Other Topics Concern  . Not on file   Social History Narrative     BP 122/82 mmHg  Pulse 77  Ht 5\' 4"  (1.626 m)  Wt 280 lb 6.4 oz (127.189 kg)  BMI 48.11 kg/m2  Physical Exam:  obese appearing middle aged woman, NAD HEENT: Unremarkable Neck:  6 cm JVD, no thyromegally Back:  No CVA tenderness Lungs:  Clear with no wheezes, rales, or rhonchi HEART:  Regular rate rhythm, no murmurs, no rubs, no  clicks Abd:  soft, positive bowel sounds, no organomegally, no rebound, no guarding Ext:  2 plus pulses, no edema, no cyanosis, no clubbing Skin:  No rashes no nodules Neuro:  CN II through XII intact, motor grossly intact  EKG - NSR with no ventricular pre-excitation   Assess/Plan:

## 2014-10-15 ENCOUNTER — Encounter (HOSPITAL_BASED_OUTPATIENT_CLINIC_OR_DEPARTMENT_OTHER): Payer: Self-pay

## 2014-10-15 ENCOUNTER — Emergency Department (HOSPITAL_BASED_OUTPATIENT_CLINIC_OR_DEPARTMENT_OTHER)
Admission: EM | Admit: 2014-10-15 | Discharge: 2014-10-15 | Disposition: A | Discharge status: Home / Self Care | Payer: Self-pay | Attending: Emergency Medicine | Admitting: Emergency Medicine

## 2014-10-15 DIAGNOSIS — Z880 Allergy status to penicillin: Secondary | ICD-10-CM | POA: Insufficient documentation

## 2014-10-15 DIAGNOSIS — E669 Obesity, unspecified: Secondary | ICD-10-CM | POA: Insufficient documentation

## 2014-10-15 DIAGNOSIS — I471 Supraventricular tachycardia: Secondary | ICD-10-CM | POA: Insufficient documentation

## 2014-10-15 DIAGNOSIS — R5383 Other fatigue: Secondary | ICD-10-CM | POA: Insufficient documentation

## 2014-10-15 DIAGNOSIS — G43909 Migraine, unspecified, not intractable, without status migrainosus: Secondary | ICD-10-CM | POA: Insufficient documentation

## 2014-10-15 DIAGNOSIS — Z79899 Other long term (current) drug therapy: Secondary | ICD-10-CM | POA: Insufficient documentation

## 2014-10-15 DIAGNOSIS — R61 Generalized hyperhidrosis: Secondary | ICD-10-CM | POA: Insufficient documentation

## 2014-10-15 DIAGNOSIS — R0602 Shortness of breath: Secondary | ICD-10-CM | POA: Insufficient documentation

## 2014-10-15 DIAGNOSIS — Z7951 Long term (current) use of inhaled steroids: Secondary | ICD-10-CM | POA: Insufficient documentation

## 2014-10-15 LAB — BASIC METABOLIC PANEL
Anion gap: 8 (ref 5–15)
BUN: 17 mg/dL (ref 6–23)
CHLORIDE: 105 mmol/L (ref 96–112)
CO2: 25 mmol/L (ref 19–32)
Calcium: 9 mg/dL (ref 8.4–10.5)
Creatinine, Ser: 1.04 mg/dL (ref 0.50–1.10)
GFR calc Af Amer: 73 mL/min — ABNORMAL LOW (ref >90)
GFR, EST NON AFRICAN AMERICAN: 63 mL/min — AB (ref >90)
Glucose, Bld: 145 mg/dL — ABNORMAL HIGH (ref 70–99)
Potassium: 3.3 mmol/L — ABNORMAL LOW (ref 3.5–5.1)
Sodium: 138 mmol/L (ref 135–145)

## 2014-10-15 LAB — CBC WITH DIFFERENTIAL/PLATELET
BASOS ABS: 0.1 10*3/uL (ref 0.0–0.1)
BASOS PCT: 1 % (ref 0–1)
Eosinophils Absolute: 1.1 10*3/uL — ABNORMAL HIGH (ref 0.0–0.7)
Eosinophils Relative: 7 % — ABNORMAL HIGH (ref 0–5)
HEMATOCRIT: 41.1 % (ref 36.0–46.0)
Hemoglobin: 13.1 g/dL (ref 12.0–15.0)
LYMPHS PCT: 15 % (ref 12–46)
Lymphs Abs: 2.2 10*3/uL (ref 0.7–4.0)
MCH: 26.5 pg (ref 26.0–34.0)
MCHC: 31.9 g/dL (ref 30.0–36.0)
MCV: 83 fL (ref 78.0–100.0)
Monocytes Absolute: 0.8 10*3/uL (ref 0.1–1.0)
Monocytes Relative: 6 % (ref 3–12)
NEUTROS ABS: 10.8 10*3/uL — AB (ref 1.7–7.7)
Neutrophils Relative %: 71 % (ref 43–77)
PLATELETS: 430 10*3/uL — AB (ref 150–400)
RBC: 4.95 MIL/uL (ref 3.87–5.11)
RDW: 15.5 % (ref 11.5–15.5)
WBC: 15 10*3/uL — AB (ref 4.0–10.5)

## 2014-10-15 MED ORDER — ADENOSINE 6 MG/2ML IV SOLN
6.0000 mg | Freq: Once | INTRAVENOUS | Status: AC
  Administered 2014-10-15: 6 mg via INTRAVENOUS
  Filled 2014-10-15: qty 2

## 2014-10-15 MED ORDER — SODIUM CHLORIDE 0.9 % IV BOLUS (SEPSIS)
1000.0000 mL | Freq: Once | INTRAVENOUS | Status: AC
  Administered 2014-10-15: 1000 mL via INTRAVENOUS

## 2014-10-15 NOTE — ED Notes (Signed)
MD at bedside. 

## 2014-10-15 NOTE — Discharge Instructions (Signed)
As discussed, it is important that you follow up as soon as possible with your physician for continued management of your condition. ° °If you develop any new, or concerning changes in your condition, please return to the emergency department immediately. ° °

## 2014-10-15 NOTE — ED Notes (Signed)
Pt reports today with palpitations and some sob, fatigue, and just pta sweating.  Reports hx of svt, unable to control with vagal maneuvers, states typically has to have adenosine.

## 2014-10-15 NOTE — ED Provider Notes (Signed)
CSN: [id number]     Arrival date & time 10/15/14  1935 History  This chart was scribed for Jennifer Munchobert Misty Rago, MD by [place] Peace, ED Scribe. The patient was seen in MH11/MH11. The patient's care was started at 7:51 PM.    Chief Complaint  Patient presents with  . Palpitations      Patient is a 49 y.o. female presenting with palpitations. The history is provided by the patient and a parent. No language interpreter was used.  Palpitations Palpitations quality:  Fast Duration:  1 day Chronicity:  Recurrent Ineffective treatments:  Beta blockers Associated symptoms: diaphoresis and shortness of breath   Associated symptoms: no nausea and no vomiting   HPI Comments: Jennifer Howard is a 49 y.o. female who presents to the Emergency Department complaining of palpitations onset today with sob, fatigue, and diaphoresis. Pt states that she no longer tries taking medication to address symptoms because they make her extremely tired. No complaints of nausea or vomiting. Mother states pt was seen by her PCP yesterday. Pt reports that she needs to have ablation of heart performed to help correct current problem but she cannot afford it right now. History of obesity and SVT.    Past Medical History  Diagnosis Date  . SVT (supraventricular tachycardia)   . Depression   . Obesity   . Migraine    Past Surgical History  Procedure Laterality Date  . Angioma cautery    . Toe surgery     Family History  Problem Relation Age of Onset  . Diabetes Mother   . Cancer Mother     bladder  . Heart attack Father   . Heart disease Father   . Urolithiasis Father    History  Substance Use Topics  . Smoking status: Never Smoker   . Smokeless tobacco: Not on file  . Alcohol Use: No   OB History    No data available     Review of Systems  Constitutional: Positive for diaphoresis and fatigue.       Per HPI, otherwise negative  HENT:       Per HPI, otherwise negative  Respiratory: Positive for shortness  of breath.        Per HPI, otherwise negative  Cardiovascular: Positive for palpitations.       Per HPI, otherwise negative  Gastrointestinal: Negative for nausea and vomiting.  Endocrine:       Negative aside from HPI  Genitourinary:       Neg aside from HPI   Musculoskeletal:       Per HPI, otherwise negative  Skin: Negative.   Neurological: Negative for syncope.      Allergies  Iodine and Penicillins  Home Medications   Prior to Admission medications   Medication Sig Start Date End Date Taking? Authorizing Provider  acetaminophen (TYLENOL) 500 MG tablet Take 1,000 mg by mouth every 6 (six) hours as needed. Patient used this medication for pain.    Historical Provider, MD  diltiazem (CARDIZEM) 120 MG tablet Take 120 mg by mouth as needed (for SVT).    Historical Provider, MD  DULoxetine (CYMBALTA) 60 MG capsule Take 120 mg by mouth daily.     Historical Provider, MD  fluticasone (FLONASE) 50 MCG/ACT nasal spray Place 2 sprays into the nose daily.    Historical Provider, MD  ibuprofen (ADVIL,MOTRIN) 200 MG tablet Take 600 mg by mouth every 6 (six) hours as needed. Patient used this medication for pain.    Historical Provider,  MD  lamoTRIgine (LAMICTAL) 200 MG tablet Take 200 mg by mouth daily.    Historical Provider, MD  trolamine salicylate (ASPERCREME) 10 % cream Apply topically as needed. Patient used this medication for foot and shoulder pain.    Historical Provider, MD  zolmitriptan (ZOMIG) 5 MG tablet Take 5 mg by mouth as needed. Patient took 2.5 mg of this medication today.    Historical Provider, MD   BP 85/51 mmHg  Pulse 183  Temp(Src) 98.3 F (36.8 C) (Oral)  Resp 19  Ht  (1.626 m)  Wt 275 lb (124.739 kg)  BMI 47.18 kg/m2  SpO2 98%  LMP 10/13/2014 Physical Exam  Constitutional: She is oriented to person, place, and time. She appears well-developed and well-nourished. No distress.  HENT:  Head: Normocephalic and atraumatic.  Eyes: Conjunctivae and EOM  are normal.  Cardiovascular: Tachycardia present.   Pulmonary/Chest: Effort normal and breath sounds normal. No stridor. No respiratory distress.  Abdominal: She exhibits no distension.  Musculoskeletal: She exhibits no edema.  Neurological: She is alert and oriented to person, place, and time. No cranial nerve deficit.  Skin: Skin is warm and dry.  Psychiatric: She has a normal mood and affect.  Nursing note and vitals reviewed.   ED Course  Procedures (including critical care time) Labs Review Labs Reviewed  BASIC METABOLIC PANEL - Abnormal; Notable for the following:    Potassium 3.3 (*)    Glucose, Bld 145 (*)    GFR calc non Af Amer 63 (*)    GFR calc Af Amer 73 (*)    All other components within normal limits  CBC WITH DIFFERENTIAL/PLATELET - Abnormal; Notable for the following:    WBC 15.0 (*)    Platelets 430 (*)    Neutro Abs 10.8 (*)    Eosinophils Relative 7 (*)    Eosinophils Absolute 1.1 (*)    All other components within normal limits     EKG Interpretation   Date/Time:  Saturday October 15 2014 19:47:22 EDT Ventricular Rate:  179 PR Interval:    QRS Duration: 88 QT Interval:  274 QTC Calculation: 473 R Axis:   77 Text Interpretation:  Supraventricular tachycardia Marked ST abnormality,  possible inferior subendocardial injury Abnormal ECG Supraventricular  tachycardia Abnormal ekg Confirmed by Jennifer Munch  MD [phone]) on  10/15/2014 8:26:25 PM     Medications  adenosine (ADENOCARD) 6 MG/2ML injection 6 mg (6 mg Intravenous Given 10/15/14 2001)  sodium chloride 0.9 % bolus 1,000 mL (0 mLs Intravenous Stopped 10/15/14 2115)    7:56 PM- Treatment plan was discussed with patient who verbalizes understanding and agrees.    Update: patient is on a cardiac monitor, resuscitation equipment, including Ambu bag, oxygen, suction, cardiac resuscitation cart available.  I discussed risks and benefits of cardioversion with patient.  Patient agrees, voices  understanding of risks, benefits.  Patient receiving adenosine, patient has cardioversion, with mild tachycardia, but otherwise sinus rhythm. negative 9:59 PM  Patient in sinus rhythmno distress, no new complaints. She will follow-up with cardiology or return for concerning changes.   MDM  Patient presents with palpitations, dyspnea, is found to be in SVT 2. This is a recurrent phenomena for the patient. Patient had successful cardioversion with adenosine. Per hours of monitoring afterwards, patient had no decompensation. Patient was discharged in stable condition to follow-up with cardiology.  CRITICAL CARE Performed by: Jennifer Munch Total critical care time: 35 Critical care time was exclusive of separately billable procedures and treating  other patients. Critical care was necessary to treat or prevent imminent or life-threatening deterioration. Critical care was time spent personally by me on the following activities: development of treatment plan with patient and/or surrogate as well as nursing, discussions with consultants, evaluation of patient's response to treatment, examination of patient, obtaining history from patient or surrogate, ordering and performing treatments and interventions, ordering and review of laboratory studies, ordering and review of radiographic studies, pulse oximetry and re-evaluation of patient's condition.  I personally performed the services described in this documentation, which was scribed in my presence. The recorded information has been reviewed and is accurate.     Jennifer Munch, MD 10/15/14 2201

## 2015-01-13 ENCOUNTER — Encounter (HOSPITAL_BASED_OUTPATIENT_CLINIC_OR_DEPARTMENT_OTHER): Payer: Self-pay

## 2015-01-13 ENCOUNTER — Emergency Department (HOSPITAL_BASED_OUTPATIENT_CLINIC_OR_DEPARTMENT_OTHER)
Admission: EM | Admit: 2015-01-13 | Discharge: 2015-01-13 | Discharge status: Against Medical Advice | Payer: Self-pay | Attending: Physician Assistant | Admitting: Physician Assistant

## 2015-01-13 DIAGNOSIS — R778 Other specified abnormalities of plasma proteins: Secondary | ICD-10-CM

## 2015-01-13 DIAGNOSIS — Z79899 Other long term (current) drug therapy: Secondary | ICD-10-CM | POA: Insufficient documentation

## 2015-01-13 DIAGNOSIS — F329 Major depressive disorder, single episode, unspecified: Secondary | ICD-10-CM | POA: Insufficient documentation

## 2015-01-13 DIAGNOSIS — I471 Supraventricular tachycardia: Secondary | ICD-10-CM | POA: Insufficient documentation

## 2015-01-13 DIAGNOSIS — Z7951 Long term (current) use of inhaled steroids: Secondary | ICD-10-CM | POA: Insufficient documentation

## 2015-01-13 DIAGNOSIS — E669 Obesity, unspecified: Secondary | ICD-10-CM | POA: Insufficient documentation

## 2015-01-13 DIAGNOSIS — R7989 Other specified abnormal findings of blood chemistry: Secondary | ICD-10-CM | POA: Insufficient documentation

## 2015-01-13 DIAGNOSIS — Z880 Allergy status to penicillin: Secondary | ICD-10-CM | POA: Insufficient documentation

## 2015-01-13 LAB — COMPREHENSIVE METABOLIC PANEL
ALK PHOS: 67 U/L (ref 38–126)
ALT: 17 U/L (ref 14–54)
ANION GAP: 8 (ref 5–15)
AST: 20 U/L (ref 15–41)
Albumin: 4.5 g/dL (ref 3.5–5.0)
BUN: 14 mg/dL (ref 6–20)
CALCIUM: 9 mg/dL (ref 8.9–10.3)
CHLORIDE: 106 mmol/L (ref 101–111)
CO2: 25 mmol/L (ref 22–32)
CREATININE: 0.9 mg/dL (ref 0.44–1.00)
Glucose, Bld: 143 mg/dL — ABNORMAL HIGH (ref 65–99)
Potassium: 4 mmol/L (ref 3.5–5.1)
SODIUM: 139 mmol/L (ref 135–145)
Total Bilirubin: 0.5 mg/dL (ref 0.3–1.2)
Total Protein: 7.3 g/dL (ref 6.5–8.1)

## 2015-01-13 LAB — CBC
HCT: 39.7 % (ref 36.0–46.0)
Hemoglobin: 12.5 g/dL (ref 12.0–15.0)
MCH: 26.1 pg (ref 26.0–34.0)
MCHC: 31.5 g/dL (ref 30.0–36.0)
MCV: 82.9 fL (ref 78.0–100.0)
Platelets: 502 10*3/uL — ABNORMAL HIGH (ref 150–400)
RBC: 4.79 MIL/uL (ref 3.87–5.11)
RDW: 15.2 % (ref 11.5–15.5)
WBC: 11.2 10*3/uL — AB (ref 4.0–10.5)

## 2015-01-13 LAB — TROPONIN I: Troponin I: 0.26 ng/mL — ABNORMAL HIGH (ref <0.031)

## 2015-01-13 MED ORDER — ADENOSINE 6 MG/2ML IV SOLN
INTRAVENOUS | Status: AC
  Filled 2015-01-13: qty 4

## 2015-01-13 MED ORDER — SODIUM CHLORIDE 0.9 % IV BOLUS (SEPSIS)
1000.0000 mL | Freq: Once | INTRAVENOUS | Status: AC
  Administered 2015-01-13: 1000 mL via INTRAVENOUS

## 2015-01-13 MED ORDER — ASPIRIN EC 325 MG PO TBEC
325.0000 mg | DELAYED_RELEASE_TABLET | Freq: Once | ORAL | Status: AC
  Administered 2015-01-13: 325 mg via ORAL
  Filled 2015-01-13: qty 1

## 2015-01-13 MED ORDER — ADENOSINE 6 MG/2ML IV SOLN
6.0000 mg | Freq: Once | INTRAVENOUS | Status: AC
  Administered 2015-01-13: 6 mg via INTRAVENOUS

## 2015-01-13 NOTE — ED Provider Notes (Addendum)
CSN: [id number]     Arrival date & time 01/13/15  1556 History   First MD Initiated Contact with Patient 01/13/15 1609     Chief Complaint  Patient presents with  . Tachycardia     (Consider location/radiation/quality/duration/timing/severity/associated sxs/prior Treatment) HPI Comments: Patient is a 49 year old female with past medical history significant for SVT. Presented today with SVT. It went on for 3 hours. She tried attempted maneuvers at home. Including ice-to-face. Patient not have any chest pain with SVT today just feeling of "fluttering". No other symptoms.   Past Medical History  Diagnosis Date  . SVT (supraventricular tachycardia)   . Depression   . Obesity   . Migraine    Past Surgical History  Procedure Laterality Date  . Angioma cautery    . Toe surgery     Family History  Problem Relation Age of Onset  . Diabetes Mother   . Cancer Mother     bladder  . Heart attack Father   . Heart disease Father   . Urolithiasis Father    History  Substance Use Topics  . Smoking status: Never Smoker   . Smokeless tobacco: Not on file  . Alcohol Use: No   OB History    No data available     Review of Systems  Constitutional: Negative for activity change.  Respiratory: Negative for shortness of breath.   Cardiovascular: Negative for chest pain.  Gastrointestinal: Negative for abdominal pain.      Allergies  Iodine and Penicillins  Home Medications   Prior to Admission medications   Medication Sig Start Date End Date Taking? Authorizing Provider  acetaminophen (TYLENOL) 500 MG tablet Take 1,000 mg by mouth every 6 (six) hours as needed. Patient used this medication for pain.    Historical Provider, MD  diltiazem (CARDIZEM) 120 MG tablet Take 120 mg by mouth as needed (for SVT).    Historical Provider, MD  DULoxetine (CYMBALTA) 60 MG capsule Take 120 mg by mouth daily.     Historical Provider, MD  fluticasone (FLONASE) 50 MCG/ACT nasal spray Place 2 sprays  into the nose daily.    Historical Provider, MD  ibuprofen (ADVIL,MOTRIN) 200 MG tablet Take 600 mg by mouth every 6 (six) hours as needed. Patient used this medication for pain.    Historical Provider, MD  lamoTRIgine (LAMICTAL) 200 MG tablet Take 200 mg by mouth daily.    Historical Provider, MD  trolamine salicylate (ASPERCREME) 10 % cream Apply topically as needed. Patient used this medication for foot and shoulder pain.    Historical Provider, MD  zolmitriptan (ZOMIG) 5 MG tablet Take 5 mg by mouth as needed. Patient took 2.5 mg of this medication today.    Historical Provider, MD   BP 102/60 mmHg  Pulse 103  Temp(Src) 98.4 F (36.9 C) (Oral)  Resp 18  Ht 5\' 3"  (1.6 m)  Wt 280 lb (127.007 kg)  BMI 49.61 kg/m2  SpO2 98%  LMP 12/23/2014 Physical Exam  Constitutional: She is oriented to person, place, and time. She appears well-developed and well-nourished.  HENT:  Head: Normocephalic and atraumatic.  Eyes: Right eye exhibits no discharge.  Cardiovascular: Normal rate, regular rhythm and normal heart sounds.   No murmur heard. Pulmonary/Chest: Effort normal and breath sounds normal. She has no wheezes. She has no rales.  Abdominal: Soft. She exhibits no distension. There is no tenderness.  Neurological: She is oriented to person, place, and time.  Skin: Skin is warm. She is diaphoretic.  Psychiatric: She has a normal mood and affect.  Nursing note and vitals reviewed.   ED Course  Procedures (including critical care time) Labs Review Labs Reviewed  CBC - Abnormal; Notable for the following:    WBC 11.2 (*)    Platelets 502 (*)    All other components within normal limits  TROPONIN I  COMPREHENSIVE METABOLIC PANEL    Imaging Review No results found.   EKG Interpretation None      MDM   Final diagnoses:  None  Patient is a 30 female presenting with SVT. Several hours. Patient denies any chest pain. We'll get initial troponin. EKG shows SVT. No acute ischemia. We  will give a liter fluid given his initial tachycardia after breaking SVT.  CRITICAL CARE Performed by: Arlana Hove   Total critical care time: 30 minutes  Critical care time was exclusive of separately billable procedures and treating other patients.  Critical care was necessary to treat or prevent imminent or life-threatening deterioration.  Critical care was time spent personally by me on the following activities: development of treatment plan with patient and/or surrogate as well as nursing, discussions with consultants, evaluation of patient's response to treatment, examination of patient, obtaining history from patient or surrogate, ordering and performing treatments and interventions, ordering and review of laboratory studies, ordering and review of radiographic studies, pulse oximetry and re-evaluation of patient's condition.   Pushed 6 mg of adenosine. SVT resolved. Pt still mildly diaphoretic.   5:39 PM Patient has elevated trop. Long patient centered discussion had withpatient and her mother. Patient and mother feel that she has no insurance would like to return home. I recommended that she be admitted given the troponin leak and her risk factors of morbid obesity. EKG does not show stemi. But I recommended admission and cards consult. Patient wants to leave  [facility].  I have told her the risks of heart attack disability and death. She knows she is welcome back any time for further care.   Indie Boehne Randall An, MD 01/13/15 1741  Atiana Levier Randall An, MD 01/13/15 6045

## 2015-01-13 NOTE — ED Notes (Signed)
C/o rapid HR x 3 hours-hx of same

## 2015-01-13 NOTE — ED Notes (Signed)
MD at bedside. 

## 2015-01-24 ENCOUNTER — Emergency Department (HOSPITAL_BASED_OUTPATIENT_CLINIC_OR_DEPARTMENT_OTHER)
Admission: EM | Admit: 2015-01-24 | Discharge: 2015-01-24 | Disposition: A | Discharge status: Home / Self Care | Payer: Self-pay | Attending: Emergency Medicine | Admitting: Emergency Medicine

## 2015-01-24 ENCOUNTER — Encounter (HOSPITAL_BASED_OUTPATIENT_CLINIC_OR_DEPARTMENT_OTHER): Payer: Self-pay | Admitting: Emergency Medicine

## 2015-01-24 DIAGNOSIS — H9201 Otalgia, right ear: Secondary | ICD-10-CM | POA: Insufficient documentation

## 2015-01-24 DIAGNOSIS — F329 Major depressive disorder, single episode, unspecified: Secondary | ICD-10-CM | POA: Insufficient documentation

## 2015-01-24 DIAGNOSIS — E669 Obesity, unspecified: Secondary | ICD-10-CM | POA: Insufficient documentation

## 2015-01-24 DIAGNOSIS — I471 Supraventricular tachycardia: Secondary | ICD-10-CM | POA: Insufficient documentation

## 2015-01-24 DIAGNOSIS — Z79899 Other long term (current) drug therapy: Secondary | ICD-10-CM | POA: Insufficient documentation

## 2015-01-24 DIAGNOSIS — G43909 Migraine, unspecified, not intractable, without status migrainosus: Secondary | ICD-10-CM | POA: Insufficient documentation

## 2015-01-24 DIAGNOSIS — K088 Other specified disorders of teeth and supporting structures: Secondary | ICD-10-CM | POA: Insufficient documentation

## 2015-01-24 DIAGNOSIS — R6884 Jaw pain: Secondary | ICD-10-CM | POA: Insufficient documentation

## 2015-01-24 DIAGNOSIS — Z880 Allergy status to penicillin: Secondary | ICD-10-CM | POA: Insufficient documentation

## 2015-01-24 DIAGNOSIS — R590 Localized enlarged lymph nodes: Secondary | ICD-10-CM | POA: Insufficient documentation

## 2015-01-24 DIAGNOSIS — K0889 Other specified disorders of teeth and supporting structures: Secondary | ICD-10-CM

## 2015-01-24 DIAGNOSIS — Z7951 Long term (current) use of inhaled steroids: Secondary | ICD-10-CM | POA: Insufficient documentation

## 2015-01-24 MED ORDER — OXYCODONE-ACETAMINOPHEN 5-325 MG PO TABS: 1.0000 | ORAL_TABLET | Freq: Four times a day (QID) | ORAL | Status: DC | PRN

## 2015-01-24 MED ORDER — OXYCODONE-ACETAMINOPHEN 5-325 MG PO TABS
1.0000 | ORAL_TABLET | Freq: Once | ORAL | Status: AC
  Administered 2015-01-24: 1 via ORAL
  Filled 2015-01-24: qty 1

## 2015-01-24 NOTE — Discharge Instructions (Signed)
Return to the ED with any concerns including difficulty breathing or swallowing, vomiting and not able to keep down liquids, decreased level of alertness/lethargy, or any other alarming symptoms °

## 2015-01-24 NOTE — ED Notes (Signed)
Patient states that she went to her dentist on Friday for dental pain and right facial pain. The patient reports that her pain on the right side of her face is worsening and effecting her righ tear./ She reports "I think I have something on my chin now"

## 2015-01-24 NOTE — ED Provider Notes (Signed)
CSN: [id number]     Arrival date & time 01/24/15  2058 History  This chart was scribed for Jerelyn Scott, MD by Doreatha Martin, ED Scribe. This patient was seen in room MH07/MH07 and the patient's care was started at 9:30 PM.     Chief Complaint  Patient presents with  . Facial Pain   Patient is a 49 y.o. female presenting with tooth pain. The history is provided by the patient. No language interpreter was used.  Dental Pain Location:  Lower Severity:  Moderate Onset quality:  Gradual Duration:  4 days Timing:  Constant Progression:  Worsening Chronicity:  New Context: abscess   Previous work-up:  Dental exam Relieved by:  Nothing Worsened by:  Nothing tried Ineffective treatments:  Acetaminophen, heat, ice and topical anesthetic gel Associated symptoms: facial pain and facial swelling   Associated symptoms: no fever     HPI Comments: Jennifer Howard is a 49 y.o. female with Hx of SVT, depression, obesity, migraine who presents to the Emergency Department complaining of moderate, gradually worsening right jaw pain onset 4 days ago and worsened today. Pt states associated right-sided otalgia, jaw swelling. She was seen at the dentist 4 days ago, Dx with an infection and was prescribed Clindamycin. She was also advised to take Advil every 4-6 hours and she notes that this does not provide any relief. She states that she called today and was not able to get an appointment until 8/16. She notes that she has tried ice, heat, rinses and Orajel with no relief. Pt was seen in the ED on 01/13/15 for an episode of SVT. She denies fever.   Past Medical History  Diagnosis Date  . SVT (supraventricular tachycardia)   . Depression   . Obesity   . Migraine    Past Surgical History  Procedure Laterality Date  . Angioma cautery    . Toe surgery     Family History  Problem Relation Age of Onset  . Diabetes Mother   . Cancer Mother     bladder  . Heart attack Father   . Heart disease Father   .  Urolithiasis Father    History  Substance Use Topics  . Smoking status: Never Smoker   . Smokeless tobacco: Not on file  . Alcohol Use: No   OB History    No data available     Review of Systems  Constitutional: Negative for fever.  HENT: Positive for dental problem, ear pain and facial swelling.   Musculoskeletal: Positive for arthralgias.  All other systems reviewed and are negative.   Allergies  Iodine and Penicillins  Home Medications   Prior to Admission medications   Medication Sig Start Date End Date Taking? Authorizing Provider  acetaminophen (TYLENOL) 500 MG tablet Take 1,000 mg by mouth every 6 (six) hours as needed. Patient used this medication for pain.    Historical Provider, MD  diltiazem (CARDIZEM) 120 MG tablet Take 120 mg by mouth as needed (for SVT).    Historical Provider, MD  DULoxetine (CYMBALTA) 60 MG capsule Take 120 mg by mouth daily.     Historical Provider, MD  fluticasone (FLONASE) 50 MCG/ACT nasal spray Place 2 sprays into the nose daily.    Historical Provider, MD  ibuprofen (ADVIL,MOTRIN) 200 MG tablet Take 600 mg by mouth every 6 (six) hours as needed. Patient used this medication for pain.    Historical Provider, MD  lamoTRIgine (LAMICTAL) 200 MG tablet Take 200 mg by mouth daily.  Historical Provider, MD  oxyCODONE-acetaminophen (PERCOCET/ROXICET) 5-325 MG per tablet Take 1-2 tablets by mouth every 6 (six) hours as needed for severe pain. 01/24/15   Jerelyn Scott, MD  trolamine salicylate (ASPERCREME) 10 % cream Apply topically as needed. Patient used this medication for foot and shoulder pain.    Historical Provider, MD  zolmitriptan (ZOMIG) 5 MG tablet Take 5 mg by mouth as needed. Patient took 2.5 mg of this medication today.    Historical Provider, MD   BP 137/91 mmHg  Pulse 75  Temp(Src) 97.7 F (36.5 C) (Oral)  Resp 20  Ht 5\' 4"  (1.626 m)  Wt 280 lb (127.007 kg)  BMI 48.04 kg/m2  SpO2 100%  LMP 12/23/2014 Vitals reviewed Physical  Exam  Physical Examination: General appearance - alert, well appearing, and in no distress Mental status - alert, oriented to person, place, and time Eyes - no conjunctival injection, no scleral icterus Mouth - mucous membranes moist, pharynx normal without lesions, ttp of right upper molar, no drainable abscess, no swelling under the tongue Neck - supple, in right anterior cervical chain there is a small tender palpable lymph node Chest - clear to auscultation, no wheezes, rales or rhonchi, symmetric air entry Neurological - alert, oriented, normal speech Extremities - peripheral pulses normal, no pedal edema, no clubbing or cyanosis Skin - normal coloration and turgor, no rashes  ED Course  Procedures (including critical care time) DIAGNOSTIC STUDIES: Oxygen Saturation is 100% on RA, normal by my interpretation.    COORDINATION OF CARE: 9:38 PM Discussed treatment plan with pt at bedside and pt agreed to plan.   Labs Review Labs Reviewed - No data to display  Imaging Review No results found.   EKG Interpretation None      MDM   Final diagnoses:  Pain, dental  Reactive cervical lymphadenopathy    Pt presenting with c/o dental pain and some pain in right upper anterior neck- there is a palpable reacdtive lymph node in this area.  Discussed this with patient, will give very small amount of pain meds, and she should continue with the ibuprofen and continue with f/u with dentist as she has scheduled.  Discharged with strict return precautions.  Pt agreeable with plan.   I personally performed the services described in this documentation, which was scribed in my presence. The recorded information has been reviewed and is accurate.    Jerelyn Scott, MD 01/25/15 1750

## 2015-03-25 ENCOUNTER — Emergency Department (HOSPITAL_BASED_OUTPATIENT_CLINIC_OR_DEPARTMENT_OTHER)
Admission: EM | Admit: 2015-03-25 | Discharge: 2015-03-25 | Disposition: A | Discharge status: Home / Self Care | Payer: Self-pay | Attending: Emergency Medicine | Admitting: Emergency Medicine

## 2015-03-25 ENCOUNTER — Encounter (HOSPITAL_BASED_OUTPATIENT_CLINIC_OR_DEPARTMENT_OTHER): Payer: Self-pay | Admitting: Emergency Medicine

## 2015-03-25 DIAGNOSIS — G43909 Migraine, unspecified, not intractable, without status migrainosus: Secondary | ICD-10-CM | POA: Insufficient documentation

## 2015-03-25 DIAGNOSIS — Z79899 Other long term (current) drug therapy: Secondary | ICD-10-CM | POA: Insufficient documentation

## 2015-03-25 DIAGNOSIS — I471 Supraventricular tachycardia: Secondary | ICD-10-CM | POA: Insufficient documentation

## 2015-03-25 DIAGNOSIS — Z7951 Long term (current) use of inhaled steroids: Secondary | ICD-10-CM | POA: Insufficient documentation

## 2015-03-25 DIAGNOSIS — F329 Major depressive disorder, single episode, unspecified: Secondary | ICD-10-CM | POA: Insufficient documentation

## 2015-03-25 DIAGNOSIS — E669 Obesity, unspecified: Secondary | ICD-10-CM | POA: Insufficient documentation

## 2015-03-25 MED ORDER — ADENOSINE 6 MG/2ML IV SOLN
6.0000 mg | Freq: Once | INTRAVENOUS | Status: AC
  Administered 2015-03-25: 6 mg via INTRAVENOUS

## 2015-03-25 MED ORDER — ADENOSINE 6 MG/2ML IV SOLN
INTRAVENOUS | Status: AC
  Filled 2015-03-25: qty 2

## 2015-03-25 NOTE — Discharge Instructions (Signed)
Supraventricular Tachycardia °Supraventricular tachycardia (SVT) is an abnormal heart rhythm (arrhythmia) that causes the heart to beat very fast (tachycardia). This kind of fast heartbeat originates in the upper chambers of the heart (atria). SVT can cause the heart to beat greater than 100 beats per minute. SVT can have a rapid burst of heartbeats. This can start and stop suddenly without warning and is called nonsustained. SVT can also be sustained, in which the heart beats at a continuous fast rate.  °CAUSES  °There can be different causes of SVT. Some of these include: °· Heart valve problems such as mitral valve prolapse. °· An enlarged heart (hypertrophic cardiomyopathy). °· Congenital heart problems. °· Heart inflammation (pericarditis). °· Hyperthyroidism. °· Low potassium or magnesium levels. °· Caffeine. °· Drug use such as cocaine, methamphetamines, or stimulants. °· Some over-the-counter medicines such as: °¨ Decongestants. °¨ Diet medicines. °¨ Herbal medicines. °SYMPTOMS  °Symptoms of SVT can vary. Symptoms depend on whether the SVT is sustained or nonsustained. You may experience: °· No symptoms (asymptomatic). °· An awareness of your heart beating rapidly (palpitations). °· Shortness of breath. °· Chest pain or pressure. °If your blood pressure drops because of the SVT, you may experience: °· Fainting or near fainting. °· Weakness. °· Dizziness. °DIAGNOSIS  °Different tests can be performed to diagnose SVT, such as: °· An electrocardiogram (EKG). This is a painless test that records the electrical activity of your heart. °· Holter monitor. This is a 24 hour recording of your heart rhythm. You will be given a diary. Write down all symptoms that you have and what you were doing at the time you experienced symptoms. °· Arrhythmia monitor. This is a small device that your wear for several weeks. It records the heart rhythm when you have symptoms. °· Echocardiogram. This is an imaging test to help detect  abnormal heart structure such as congenital abnormalities, heart valve problems, or heart enlargement. °· Stress test. This test can help determine if the SVT is related to exercise. °· Electrophysiology study (EPS). This is a procedure that evaluates your heart's electrical system and can help your caregiver find the cause of your SVT. °TREATMENT  °Treatment of SVT depends on the symptoms, how often it recurs, and whether there are any underlying heart problems.  °· If symptoms are rare and no other cardiac disease is present, no treatment may be needed. °· Blood work may be done to check potassium, magnesium, and thyroid hormone levels to see if they are abnormal. If these levels are abnormal, treatment to correct the problems will occur. °Medicines °Your caregiver may use oral medicines to treat SVT. These medicines are given for long-term control of SVT. Medicines may be used alone or in combination with other treatments. These medicines work to slow nerve impulses in the heart muscle. These medicines can also be used to treat high blood pressure. Some of these medicines may include: °· Calcium channel blockers. °· Beta blockers. °· Digoxin. °Nonsurgical procedures °Nonsurgical techniques may be used if oral medicines do not work. Some examples include: °· Cardioversion. This technique uses either drugs or an electrical shock to restore a normal heart rhythm. °¨ Cardioversion drugs may be given through an intravenous (IV) line to help "reset" the heart rhythm. °¨ In electrical cardioversion, the caregiver shocks your heart to stop its beat for a split second. This helps to reset the heart to a normal rhythm. °· Ablation. This procedure is done under mild sedation. High frequency radio wave energy is used to   destroy the area of heart tissue responsible for the SVT. °HOME CARE INSTRUCTIONS  °· Do not smoke. °· Only take medicines prescribed by your caregiver. Check with your caregiver before using over-the-counter  medicines. °· Check with your caregiver about how much alcohol and caffeine (coffee, tea, colas, or chocolate) you may have. °· It is very important to keep all follow-up referrals and appointments in order to properly manage this problem. °SEEK IMMEDIATE MEDICAL CARE IF: °· You have dizziness. °· You faint or nearly faint. °· You have shortness of breath. °· You have chest pain or pressure. °· You have sudden nausea or vomiting. °· You have profuse sweating. °· You are concerned about how long your symptoms last. °· You are concerned about the frequency of your SVT episodes. °If you have the above symptoms, call your local emergency services (911 in [place] immediately. Do not drive yourself to the hospital. °MAKE SURE YOU:  °· Understand these instructions. °· Will watch your condition. °· Will get help right away if you are not doing well or get worse. °Document Released: 06/10/2005 Document Revised: 09/02/2011 Document Reviewed: 09/22/2008 °[employer]® Patient Information ©2015 [employer]. This information is not intended to replace advice given to you by your health care provider. Make sure you discuss any questions you have with your health care provider. ° °

## 2015-03-25 NOTE — ED Notes (Signed)
Pt in SVT, alert & oriented, interactive. HR 178, BP 146/79, RR 20. 6 mg of Adenosine given at 1858 with 20 mL flush behind. HR down to 105-120 initially. Rhythm noted as ST at 1859, repeated 12 Lead EKG which confirmed.

## 2015-03-25 NOTE — ED Notes (Signed)
Pt states she went into svt around 5pm and was out shopping

## 2015-03-25 NOTE — ED Provider Notes (Signed)
CSN: [id number]     Arrival date & time 03/25/15  4098 History  By signing my name below, I, Phillis Haggis, attest that this documentation has been prepared under the direction and in the presence of Elwin Mocha, MD. Electronically Signed: Phillis Haggis, ED Scribe. 03/25/2015. 6:59 PM.   Chief Complaint  Patient presents with  . Tachycardia   Patient is a 49 y.o. female presenting with palpitations. The history is provided by the patient. No language interpreter was used.  Palpitations Palpitations quality:  Regular Onset quality:  Sudden Timing:  Constant Progression:  Unchanged Chronicity:  Chronic Context: not anxiety, not illicit drugs and not nicotine   Relieved by:  Nothing Worsened by:  Nothing  HPI Comments: Jennifer Howard is a 49 y.o. Female with hx of DM and HTN who presents to the Emergency Department complaining of increased heart rate onset PTA. Pt reports hx of similar symptoms and states that the medication she is given in the ED usually works for her, but does not have home medications. States that it has been a while since she had an episode. She reports that she typically tries to elevate her legs and do some crunches when she has an episode, to no relief. Denies trying anything for her symptoms PTA. Denies any other symptoms. States her doctor is Dr. Ladona Ridgel at [facility].  Past Medical History  Diagnosis Date  . SVT (supraventricular tachycardia) (HCC)   . Depression   . Obesity   . Migraine    Past Surgical History  Procedure Laterality Date  . Angioma cautery    . Toe surgery     Family History  Problem Relation Age of Onset  . Diabetes Mother   . Cancer Mother     bladder  . Heart attack Father   . Heart disease Father   . Urolithiasis Father    Social History  Substance Use Topics  . Smoking status: Never Smoker   . Smokeless tobacco: None  . Alcohol Use: No   OB History    No data available     Review of Systems  Cardiovascular: Positive for  palpitations.       Tachycardia  All other systems reviewed and are negative.  Allergies  Iodine and Penicillins  Home Medications   Prior to Admission medications   Medication Sig Start Date End Date Taking? Authorizing Provider  acetaminophen (TYLENOL) 500 MG tablet Take 1,000 mg by mouth every 6 (six) hours as needed. Patient used this medication for pain.    Historical Provider, MD  diltiazem (CARDIZEM) 120 MG tablet Take 120 mg by mouth as needed (for SVT).    Historical Provider, MD  DULoxetine (CYMBALTA) 60 MG capsule Take 120 mg by mouth daily.     Historical Provider, MD  fluticasone (FLONASE) 50 MCG/ACT nasal spray Place 2 sprays into the nose daily.    Historical Provider, MD  ibuprofen (ADVIL,MOTRIN) 200 MG tablet Take 600 mg by mouth every 6 (six) hours as needed. Patient used this medication for pain.    Historical Provider, MD  lamoTRIgine (LAMICTAL) 200 MG tablet Take 200 mg by mouth daily.    Historical Provider, MD  oxyCODONE-acetaminophen (PERCOCET/ROXICET) 5-325 MG per tablet Take 1-2 tablets by mouth every 6 (six) hours as needed for severe pain. 01/24/15   Jerelyn Scott, MD  trolamine salicylate (ASPERCREME) 10 % cream Apply topically as needed. Patient used this medication for foot and shoulder pain.    Historical Provider, MD  zolmitriptan (ZOMIG)  5 MG tablet Take 5 mg by mouth as needed. Patient took 2.5 mg of this medication today.    Historical Provider, MD   BP 124/89 mmHg  Pulse 188  Temp(Src) 98.9 F (37.2 C) (Oral)  Resp 20  SpO2 100%  LMP 02/24/2015 Physical Exam  Constitutional: She is oriented to person, place, and time. She appears well-developed and well-nourished. No distress.  HENT:  Head: Normocephalic and atraumatic.  Mouth/Throat: Oropharynx is clear and moist.  Eyes: EOM are normal. Pupils are equal, round, and reactive to light.  Neck: Normal range of motion. Neck supple.  Cardiovascular: Regular rhythm.  Tachycardia present.  Exam reveals  no friction rub.   No murmur heard. Pulmonary/Chest: Effort normal and breath sounds normal. No respiratory distress. She has no wheezes. She has no rales.  Abdominal: Soft. She exhibits no distension. There is no tenderness. There is no rebound.  Musculoskeletal: Normal range of motion. She exhibits no edema.  Neurological: She is alert and oriented to person, place, and time.  Skin: She is not diaphoretic.  Nursing note and vitals reviewed.   ED Course  Procedures (including critical care time) DIAGNOSTIC STUDIES: Oxygen Saturation is 100% on RA, normal by my interpretation.    COORDINATION OF CARE: 6:55 PM-Discussed treatment plan which includes adenosine and IV with pt at bedside and pt agreed to plan.   Labs Review Labs Reviewed - No data to display  Imaging Review No results found.    EKG Interpretation   Date/Time:  Saturday March 25 2015 19:00:53 EDT Ventricular Rate:  121 PR Interval:  160 QRS Duration: 80 QT Interval:  310 QTC Calculation: 440 R Axis:   70 Text Interpretation:  ED PHYSICIAN INTERPRETATION AVAILABLE IN [facility] Sinus tachycardia Right atrial enlargement Nonspecific ST  abnormality Abnormal ECG Confirmed by TEST, Record ([id number]) on 03/26/2015  7:47:38 AM      MDM   Final diagnoses:  SVT (supraventricular tachycardia) (HCC)    49 year old female here with SVT. History of SVT, has not seen cardiology lately and is has refused to get an ablation. No relief with vagal maneuvers. Given 6 mg of adenosine and she converted back to sinus rhythm. I observe her for a while and she remained in sinus rhythm. Stable for discharge. She knows to follow-up with cardiology soon   I personally performed the services described in this documentation, which was scribed in my presence. The recorded information has been reviewed and is accurate.      Elwin Mocha, MD 03/27/15 [phone]

## 2015-06-22 ENCOUNTER — Emergency Department (HOSPITAL_BASED_OUTPATIENT_CLINIC_OR_DEPARTMENT_OTHER)
Admission: EM | Admit: 2015-06-22 | Discharge: 2015-06-22 | Disposition: A | Discharge status: Home / Self Care | Payer: Self-pay | Attending: Emergency Medicine | Admitting: Emergency Medicine

## 2015-06-22 ENCOUNTER — Encounter (HOSPITAL_BASED_OUTPATIENT_CLINIC_OR_DEPARTMENT_OTHER): Payer: Self-pay | Admitting: *Deleted

## 2015-06-22 DIAGNOSIS — I471 Supraventricular tachycardia: Secondary | ICD-10-CM | POA: Insufficient documentation

## 2015-06-22 DIAGNOSIS — E669 Obesity, unspecified: Secondary | ICD-10-CM | POA: Insufficient documentation

## 2015-06-22 DIAGNOSIS — Z79899 Other long term (current) drug therapy: Secondary | ICD-10-CM | POA: Insufficient documentation

## 2015-06-22 DIAGNOSIS — Z7951 Long term (current) use of inhaled steroids: Secondary | ICD-10-CM | POA: Insufficient documentation

## 2015-06-22 DIAGNOSIS — G43909 Migraine, unspecified, not intractable, without status migrainosus: Secondary | ICD-10-CM | POA: Insufficient documentation

## 2015-06-22 DIAGNOSIS — F329 Major depressive disorder, single episode, unspecified: Secondary | ICD-10-CM | POA: Insufficient documentation

## 2015-06-22 DIAGNOSIS — Z880 Allergy status to penicillin: Secondary | ICD-10-CM | POA: Insufficient documentation

## 2015-06-22 LAB — BASIC METABOLIC PANEL
Anion gap: 4 — ABNORMAL LOW (ref 5–15)
BUN: 11 mg/dL (ref 6–20)
CO2: 26 mmol/L (ref 22–32)
CREATININE: 0.7 mg/dL (ref 0.44–1.00)
Calcium: 8.5 mg/dL — ABNORMAL LOW (ref 8.9–10.3)
Chloride: 109 mmol/L (ref 101–111)
GFR calc Af Amer: >60 mL/min (ref >60)
Glucose, Bld: 113 mg/dL — ABNORMAL HIGH (ref 65–99)
Potassium: 3.3 mmol/L — ABNORMAL LOW (ref 3.5–5.1)
SODIUM: 139 mmol/L (ref 135–145)

## 2015-06-22 LAB — CBC WITH DIFFERENTIAL/PLATELET
BASOS ABS: 0.1 10*3/uL (ref 0.0–0.1)
Basophils Relative: 1 %
EOS ABS: 0.9 10*3/uL — AB (ref 0.0–0.7)
Eosinophils Relative: 11 %
HCT: 37.6 % (ref 36.0–46.0)
Hemoglobin: 12.1 g/dL (ref 12.0–15.0)
Lymphocytes Relative: 19 %
Lymphs Abs: 1.5 10*3/uL (ref 0.7–4.0)
MCH: 27 pg (ref 26.0–34.0)
MCHC: 32.2 g/dL (ref 30.0–36.0)
MCV: 83.9 fL (ref 78.0–100.0)
MONO ABS: 0.6 10*3/uL (ref 0.1–1.0)
Monocytes Relative: 8 %
Neutro Abs: 4.9 10*3/uL (ref 1.7–7.7)
Neutrophils Relative %: 61 %
Platelets: 342 10*3/uL (ref 150–400)
RBC: 4.48 MIL/uL (ref 3.87–5.11)
RDW: 15.8 % — AB (ref 11.5–15.5)
WBC: 8 10*3/uL (ref 4.0–10.5)

## 2015-06-22 LAB — TROPONIN I: TROPONIN I: 0.05 ng/mL — AB (ref <0.031)

## 2015-06-22 MED ORDER — SODIUM CHLORIDE 0.9 % IV BOLUS (SEPSIS)
1000.0000 mL | Freq: Once | INTRAVENOUS | Status: AC
  Administered 2015-06-22: 1000 mL via INTRAVENOUS

## 2015-06-22 MED ORDER — METOPROLOL TARTRATE 25 MG PO TABS: 25.0000 mg | ORAL_TABLET | Freq: Once | ORAL | Status: DC

## 2015-06-22 MED ORDER — POTASSIUM CHLORIDE CRYS ER 20 MEQ PO TBCR
20.0000 meq | EXTENDED_RELEASE_TABLET | Freq: Once | ORAL | Status: AC
  Administered 2015-06-22: 20 meq via ORAL
  Filled 2015-06-22: qty 1

## 2015-06-22 NOTE — ED Provider Notes (Signed)
CSN: [id number]     Arrival date & time 06/22/15  1545 History   First MD Initiated Contact with Patient 06/22/15 1631     Chief Complaint  Patient presents with  . Palpitations     Patient is a 49 y.o. female presenting with palpitations. The history is provided by the patient. No language interpreter was used.  Palpitations  Jennifer Howard is a 49 y.o. female who presents to the Emergency Department complaining of palpitations. She has an history of SVT and states she started feeling palpitations when she was at rest around 1:00 today. Symptoms persisted for 3 hours and she presented to the emergency department. While she was in triage she experienced pain with the blood pressure cuff pulling up and she sneezed and feels like that may have slow down her heart rate. She reports associated generalized weakness, nausea, diaphoresis. This happens with prior episodes of SVT. For this once an ablation was recommended but she did not have the money to pay for it at that time. Symptoms are moderate, waxing and waning, improving.   Past Medical History  Diagnosis Date  . SVT (supraventricular tachycardia) (HCC)   . Depression   . Obesity   . Migraine    Past Surgical History  Procedure Laterality Date  . Angioma cautery    . Toe surgery     Family History  Problem Relation Age of Onset  . Diabetes Mother   . Cancer Mother     bladder  . Heart attack Father   . Heart disease Father   . Urolithiasis Father    Social History  Substance Use Topics  . Smoking status: Never Smoker   . Smokeless tobacco: None  . Alcohol Use: No   OB History    No data available     Review of Systems  Cardiovascular: Positive for palpitations.  All other systems reviewed and are negative.     Allergies  Iodine and Penicillins  Home Medications   Prior to Admission medications   Medication Sig Start Date End Date Taking? Authorizing Provider  acetaminophen (TYLENOL) 500 MG tablet Take 1,000 mg  by mouth every 6 (six) hours as needed. Patient used this medication for pain.    Historical Provider, MD  diltiazem (CARDIZEM) 120 MG tablet Take 120 mg by mouth as needed (for SVT).    Historical Provider, MD  DULoxetine (CYMBALTA) 60 MG capsule Take 120 mg by mouth daily.     Historical Provider, MD  fluticasone (FLONASE) 50 MCG/ACT nasal spray Place 2 sprays into the nose daily.    Historical Provider, MD  ibuprofen (ADVIL,MOTRIN) 200 MG tablet Take 600 mg by mouth every 6 (six) hours as needed. Patient used this medication for pain.    Historical Provider, MD  lamoTRIgine (LAMICTAL) 200 MG tablet Take 200 mg by mouth daily.    Historical Provider, MD  metoprolol tartrate (LOPRESSOR) 25 MG tablet Take 1 tablet (25 mg total) by mouth once. Take one tablet if you feel palpitations. You make take a second tablet thirty minutes later if you continue to feel rapid heart rate.  Do not take more than two tablets in a day. 06/22/15   Tilden FossaElizabeth Shernita Rabinovich, MD  oxyCODONE-acetaminophen (PERCOCET/ROXICET) 5-325 MG per tablet Take 1-2 tablets by mouth every 6 (six) hours as needed for severe pain. 01/24/15   Jerelyn ScottMartha Linker, MD  trolamine salicylate (ASPERCREME) 10 % cream Apply topically as needed. Patient used this medication for foot and shoulder pain.  Historical Provider, MD  zolmitriptan (ZOMIG) 5 MG tablet Take 5 mg by mouth as needed. Patient took 2.5 mg of this medication today.    Historical Provider, MD   BP 122/91 mmHg  Pulse 91  Temp(Src) 98.4 F (36.9 C)  Resp 17  SpO2 100%  LMP 04/08/2015 Physical Exam  Constitutional: She is oriented to person, place, and time. She appears well-developed and well-nourished.  HENT:  Head: Normocephalic and atraumatic.  Cardiovascular: Regular rhythm.   No murmur heard. Tachycardic  Pulmonary/Chest: Effort normal and breath sounds normal. No respiratory distress.  Abdominal: Soft. There is no tenderness. There is no rebound and no guarding.  Musculoskeletal:  She exhibits no edema or tenderness.  Neurological: She is alert and oriented to person, place, and time.  Skin: Skin is warm and dry.  Psychiatric: She has a normal mood and affect. Her behavior is normal.  Nursing note and vitals reviewed.   ED Course  Procedures (including critical care time) Labs Review Labs Reviewed  BASIC METABOLIC PANEL - Abnormal; Notable for the following:    Potassium 3.3 (*)    Glucose, Bld 113 (*)    Calcium 8.5 (*)    Anion gap 4 (*)    All other components within normal limits  CBC WITH DIFFERENTIAL/PLATELET - Abnormal; Notable for the following:    RDW 15.8 (*)    Eosinophils Absolute 0.9 (*)    All other components within normal limits  TROPONIN I - Abnormal; Notable for the following:    Troponin I 0.05 (*)    All other components within normal limits    Imaging Review No results found. I have personally reviewed and evaluated these images and lab results as part of my medical decision-making.   EKG Interpretation   Date/Time:  Thursday June 22 2015 16:19:13 EST Ventricular Rate:  112 PR Interval:  156 QRS Duration: 80 QT Interval:  328 QTC Calculation: 447 R Axis:   60 Text Interpretation:  Sinus tachycardia Possible Left atrial enlargement  Borderline ECG Confirmed by Lincoln Brigham [phone]) on 06/22/2015 4:26:16 PM      MDM   Final diagnoses:  PSVT (paroxysmal supraventricular tachycardia) (HCC)    Patient with history of SVT here for symptoms similar to prior episodes of SVT. Patient converted prior to the EKG in the emergency department, symptoms are improved. Electrolytes with no significant abnormality. She does have a minimally elevated troponin. Discussed with patient recommendation for recheck of her troponin as well as cardiology discussion and patient declines repeat labs and wishes to go home. Provided prescription for metoprolol and event she has not recurrent episode of SVT that she can treat at home. Provided referral  to cardiology for follow-up as well as return precautions.  Tilden Fossa, MD 06/23/15 [phone]

## 2015-06-22 NOTE — ED Notes (Signed)
Pt. Reports she has a history of SVT, last time was a couple a months ago and was given adenosine.  Pt. Reports he heart rate has been in the 204 range.

## 2015-06-22 NOTE — Discharge Instructions (Signed)
You have SVT and need to follow up with a Cardiologist.  Your heart enzyme was mildly elevated today, which can happen from injury to your heart.  Get rechecked immediately if you develop any new or worrisome symptoms.    Paroxysmal Supraventricular Tachycardia Paroxysmal supraventricular tachycardia (PSVT) is a type of abnormal heart rhythm. It causes your heart to beat very quickly and then suddenly stop beating so quickly. A normal heart rate is 60-100 beats per minute. During an episode of PSVT, your heart rate may be 150-250 beats per minute. This can make you feel light-headed and short of breath. An episode of PSVT can be frightening. It is usually not dangerous. The heart has four chambers. All chambers need to work together for the heart to beat effectively. A normal heartbeat usually starts in the right upper chamber of the heart (atrium) when an area (sinoatrial node) puts out an electrical signal that spreads to the other chambers. People with PSVT may have abnormal electrical pathways, or they may have other areas in the upper chambers that send out electrical signals. The result is a very rapid heartbeat. When your heart beats very quickly, it does not have time to fill completely with blood. When PSVT happens often or it lasts for long periods, it can lead to heart weakness and failure. Most people with PSVT do not have any other heart disease. CAUSES Abnormal electrical activity in the heart causes PSVT. It is not known why some people get PSVT and others do not. RISK FACTORS You may be more likely to have PSVT if:  You are 2120-47 years old.  You are a woman. Other factors that may increase your chances of an attack include:  Stress.  Being tired.  Smoking.  Stimulant drugs.  Alcoholic drinks.  Caffeine.  Pregnancy. SIGNS AND SYMPTOMS A mild episode of PSVT may cause no symptoms. If you do have signs and symptoms, they may include:  A pounding heart.  Feeling of  skipped heartbeats (palpitations).  Weakness.  Shortness of breath.  Tightness or pain in your chest.  Light-headedness.  Anxiety.  Dizziness.  Sweating.  Nausea.  A fainting spell. DIAGNOSIS Your health care provider may suspect PSVT if you have symptoms that come and go. The health care provider will do a physical exam. If you are having an episode during the exam, the health care provider may be able to diagnose PSVT by listening to your heart and feeling your pulse. Tests may also be done, including:  An electrical study of your heart (electrocardiogram, or ECG).  A test in which you wear a portable ECG monitor all day (Holter monitor) or for several days (event monitor).  A test that involves taking an image of your heart using sound waves (echocardiogram) to rule out other causes of a fast heart rate. TREATMENT You may not need treatment if episodes of PSVT do not happen often or if they do not cause symptoms. If PSVT episodes do cause symptoms, your health care provider may first suggest trying a self-treatment called vagus nerve stimulation. The vagus nerve extends down from the brain. It regulates certain body functions. Stimulating this nerve can slow down the heart. Your health care provider can teach you ways to do this. You may need to try a few ways to find what works best for you. Options include:  Holding your breath and pushing, as though you are having a bowel movement.  Massaging an area on one side of your neck below  your jaw.  Bending forward with your head between your legs.  Bending forward with your head between your legs and coughing.  Massaging your eyeballs with your eyes closed. If vagus nerve stimulation does not work, other treatment options include:  Medicines to prevent an attack.  Being treated in the hospital with medicine or electric shock to stop an attack (cardioversion). This treatment can include:  Getting medicine through an IV  line.  Having a small electric shock delivered to your heart. You will be given medicine to make you sleep through this procedure.  If you have frequent episodes with symptoms, you may need a procedure to get rid of the faulty areas of your heart (radiofrequency ablation) and end the episodes of PSVT. In this procedure:  A long, thin tube (catheter) is passed through one of your veins into your heart.  Energy directed through the catheter eliminates the areas of your heart that are causing abnormal electric stimulation. HOME CARE INSTRUCTIONS  Take medicines only as directed by your health care provider.  Do not use caffeine in any form if caffeine triggers episodes of PSVT. Otherwise, consume caffeine in moderation. This means no more than a few cups of coffee or the equivalent each day.  Do not drink alcohol if alcohol triggers episodes of PSVT. Otherwise, limit alcohol intake to no more than 1 drink per day for nonpregnant women and 2 drinks per day for men. One drink equals 12 ounces of beer, 5 ounces of wine, or 1 ounces of hard liquor.  Do not use any tobacco products, including cigarettes, chewing tobacco, or electronic cigarettes. If you need help quitting, ask your health care provider.  Try to get at least 7 hours of sleep each night.  Find healthy ways to manage stress.  Perform vagus nerve stimulation as directed by your health care provider.  Maintain a healthy weight.  Get some exercise on most days. Ask your health care provider to suggest some good activities for you. SEEK MEDICAL CARE IF:  You are having episodes of PSVT more often, or they are lasting longer.  Vagus nerve stimulation is no longer helping.  You have new symptoms during an episode. SEEK IMMEDIATE MEDICAL CARE IF:  You have chest pain or trouble breathing.  You have an episode of PSVT that has lasted longer than 20 minutes.  You have passed out from an episode of PSVT. These symptoms may  represent a serious problem that is an emergency. Do not wait to see if the symptoms will go away. Get medical help right away. Call your local emergency services (911 in the [place] Do not drive yourself to the hospital.   This information is not intended to replace advice given to you by your health care provider. Make sure you discuss any questions you have with your health care provider.   Document Released: 06/10/2005 Document Revised: 07/01/2014 Document Reviewed: 11/18/2013 Elsevier Interactive Patient Education [employer]

## 2015-06-27 ENCOUNTER — Encounter (HOSPITAL_BASED_OUTPATIENT_CLINIC_OR_DEPARTMENT_OTHER): Payer: Self-pay | Admitting: *Deleted

## 2015-06-27 ENCOUNTER — Emergency Department (HOSPITAL_BASED_OUTPATIENT_CLINIC_OR_DEPARTMENT_OTHER)
Admission: EM | Admit: 2015-06-27 | Discharge: 2015-06-27 | Disposition: A | Discharge status: Home / Self Care | Payer: Self-pay | Attending: Emergency Medicine | Admitting: Emergency Medicine

## 2015-06-27 DIAGNOSIS — Z7951 Long term (current) use of inhaled steroids: Secondary | ICD-10-CM | POA: Insufficient documentation

## 2015-06-27 DIAGNOSIS — E669 Obesity, unspecified: Secondary | ICD-10-CM | POA: Insufficient documentation

## 2015-06-27 DIAGNOSIS — R079 Chest pain, unspecified: Secondary | ICD-10-CM | POA: Insufficient documentation

## 2015-06-27 DIAGNOSIS — F329 Major depressive disorder, single episode, unspecified: Secondary | ICD-10-CM | POA: Insufficient documentation

## 2015-06-27 DIAGNOSIS — Z880 Allergy status to penicillin: Secondary | ICD-10-CM | POA: Insufficient documentation

## 2015-06-27 DIAGNOSIS — Z79899 Other long term (current) drug therapy: Secondary | ICD-10-CM | POA: Insufficient documentation

## 2015-06-27 DIAGNOSIS — I471 Supraventricular tachycardia: Secondary | ICD-10-CM | POA: Insufficient documentation

## 2015-06-27 DIAGNOSIS — G43909 Migraine, unspecified, not intractable, without status migrainosus: Secondary | ICD-10-CM | POA: Insufficient documentation

## 2015-06-27 LAB — COMPREHENSIVE METABOLIC PANEL
ALBUMIN: 4.6 g/dL (ref 3.5–5.0)
ALT: 14 U/L (ref 14–54)
ANION GAP: 9 (ref 5–15)
AST: 17 U/L (ref 15–41)
Alkaline Phosphatase: 74 U/L (ref 38–126)
BILIRUBIN TOTAL: 0.5 mg/dL (ref 0.3–1.2)
BUN: 13 mg/dL (ref 6–20)
CALCIUM: 9.1 mg/dL (ref 8.9–10.3)
CO2: 24 mmol/L (ref 22–32)
Chloride: 104 mmol/L (ref 101–111)
Creatinine, Ser: 0.88 mg/dL (ref 0.44–1.00)
GFR calc Af Amer: >60 mL/min (ref >60)
GFR calc non Af Amer: >60 mL/min (ref >60)
Glucose, Bld: 135 mg/dL — ABNORMAL HIGH (ref 65–99)
Potassium: 3.6 mmol/L (ref 3.5–5.1)
Sodium: 137 mmol/L (ref 135–145)
TOTAL PROTEIN: 7.5 g/dL (ref 6.5–8.1)

## 2015-06-27 LAB — CBC WITH DIFFERENTIAL/PLATELET
Basophils Absolute: 0.1 10*3/uL (ref 0.0–0.1)
Basophils Relative: 1 %
Eosinophils Absolute: 1.2 10*3/uL — ABNORMAL HIGH (ref 0.0–0.7)
Eosinophils Relative: 9 %
HEMATOCRIT: 45.2 % (ref 36.0–46.0)
HEMOGLOBIN: 14.6 g/dL (ref 12.0–15.0)
LYMPHS PCT: 21 %
Lymphs Abs: 2.8 10*3/uL (ref 0.7–4.0)
MCH: 26.9 pg (ref 26.0–34.0)
MCHC: 32.3 g/dL (ref 30.0–36.0)
MCV: 83.4 fL (ref 78.0–100.0)
Monocytes Absolute: 0.7 10*3/uL (ref 0.1–1.0)
Monocytes Relative: 5 %
Neutro Abs: 8.7 10*3/uL — ABNORMAL HIGH (ref 1.7–7.7)
Neutrophils Relative %: 64 %
Platelets: 559 10*3/uL — ABNORMAL HIGH (ref 150–400)
RBC: 5.42 MIL/uL — ABNORMAL HIGH (ref 3.87–5.11)
RDW: 16.1 % — ABNORMAL HIGH (ref 11.5–15.5)
WBC: 13.5 10*3/uL — AB (ref 4.0–10.5)

## 2015-06-27 MED ORDER — POTASSIUM CHLORIDE CRYS ER 20 MEQ PO TBCR
40.0000 meq | EXTENDED_RELEASE_TABLET | Freq: Once | ORAL | Status: AC
  Administered 2015-06-27: 40 meq via ORAL
  Filled 2015-06-27: qty 2

## 2015-06-27 MED ORDER — SODIUM CHLORIDE 0.9 % IV BOLUS (SEPSIS)
1000.0000 mL | Freq: Once | INTRAVENOUS | Status: AC
  Administered 2015-06-27: 1000 mL via INTRAVENOUS

## 2015-06-27 MED ORDER — ADENOSINE 6 MG/2ML IV SOLN
INTRAVENOUS | Status: AC
  Administered 2015-06-27: 6 mg
  Filled 2015-06-27: qty 4

## 2015-06-27 NOTE — ED Provider Notes (Signed)
CSN: [id number]     Arrival date & time 06/27/15  1242 History   First MD Initiated Contact with Patient 06/27/15 1320     Chief Complaint  Patient presents with  . Palpitations     (Consider location/radiation/quality/duration/timing/severity/associated sxs/prior Treatment) Patient is a 50 y.o. female presenting with palpitations.  Palpitations Palpitations quality:  Regular Onset quality:  Sudden Timing:  Constant Progression:  Unchanged Chronicity:  Recurrent Context: caffeine   Relieved by:  Nothing Worsened by:  Nothing Ineffective treatments:  Deep relaxation, Valsalva and breathing exercises (sneezing, BM) Associated symptoms: chest pain (tightness with rapid hr)   Associated symptoms: no back pain, no cough, no nausea, no near-syncope, no shortness of breath, no syncope, no vomiting and no weakness   Risk factors comment:  Hx of SVT, seen her last Thurs for same, not taking daily med given didnot tolerate them, saw Dr. Ladona Ridgel, wants ablation however did not have insurance previously   Past Medical History  Diagnosis Date  . SVT (supraventricular tachycardia) (HCC)   . Depression   . Obesity   . Migraine    Past Surgical History  Procedure Laterality Date  . Angioma cautery    . Toe surgery     Family History  Problem Relation Age of Onset  . Diabetes Mother   . Cancer Mother     bladder  . Heart attack Father   . Heart disease Father   . Urolithiasis Father    Social History  Substance Use Topics  . Smoking status: Never Smoker   . Smokeless tobacco: None  . Alcohol Use: No   OB History    No data available     Review of Systems  Constitutional: Negative for fever.  HENT: Negative for sore throat.   Eyes: Negative for visual disturbance.  Respiratory: Negative for cough and shortness of breath.   Cardiovascular: Positive for chest pain (tightness with rapid hr) and palpitations. Negative for syncope and near-syncope.  Gastrointestinal: Negative  for nausea, vomiting and abdominal pain.  Genitourinary: Negative for difficulty urinating.  Musculoskeletal: Negative for back pain and neck pain.  Skin: Negative for rash.  Neurological: Positive for headaches (mild). Negative for syncope, weakness and light-headedness.      Allergies  Iodine and Penicillins  Home Medications   Prior to Admission medications   Medication Sig Start Date End Date Taking? Authorizing Provider  acetaminophen (TYLENOL) 500 MG tablet Take 1,000 mg by mouth every 6 (six) hours as needed. Patient used this medication for pain.    Historical Provider, MD  diltiazem (CARDIZEM) 120 MG tablet Take 120 mg by mouth as needed (for SVT).    Historical Provider, MD  DULoxetine (CYMBALTA) 60 MG capsule Take 120 mg by mouth daily.     Historical Provider, MD  fluticasone (FLONASE) 50 MCG/ACT nasal spray Place 2 sprays into the nose daily.    Historical Provider, MD  ibuprofen (ADVIL,MOTRIN) 200 MG tablet Take 600 mg by mouth every 6 (six) hours as needed. Patient used this medication for pain.    Historical Provider, MD  lamoTRIgine (LAMICTAL) 200 MG tablet Take 200 mg by mouth daily.    Historical Provider, MD  metoprolol tartrate (LOPRESSOR) 25 MG tablet Take 1 tablet (25 mg total) by mouth once. Take one tablet if you feel palpitations. You make take a second tablet thirty minutes later if you continue to feel rapid heart rate.  Do not take more than two tablets in a day. 06/22/15  Tilden Fossa, MD  oxyCODONE-acetaminophen (PERCOCET/ROXICET) 5-325 MG per tablet Take 1-2 tablets by mouth every 6 (six) hours as needed for severe pain. 01/24/15   Jerelyn Scott, MD  trolamine salicylate (ASPERCREME) 10 % cream Apply topically as needed. Patient used this medication for foot and shoulder pain.    Historical Provider, MD  zolmitriptan (ZOMIG) 5 MG tablet Take 5 mg by mouth as needed. Patient took 2.5 mg of this medication today.    Historical Provider, MD   BP 100/71 mmHg   Pulse 105  Temp(Src) 98.1 F (36.7 C) (Oral)  Resp 17  Ht 5\' 4"  (1.626 m)  Wt 280 lb (127.007 kg)  BMI 48.04 kg/m2  SpO2 100%  LMP 04/08/2015 Physical Exam  Constitutional: She is oriented to person, place, and time. She appears well-developed and well-nourished. No distress.  HENT:  Head: Normocephalic and atraumatic.  Mouth/Throat: Oropharynx is clear and moist. No oropharyngeal exudate.  Eyes: Conjunctivae and EOM are normal. Pupils are equal, round, and reactive to light.  Neck: Normal range of motion.  Cardiovascular: Regular rhythm, normal heart sounds and intact distal pulses.  Tachycardia present.  Exam reveals no gallop and no friction rub.   No murmur heard. Pulmonary/Chest: Effort normal and breath sounds normal. No respiratory distress. She has no wheezes. She has no rales.  Abdominal: Soft. She exhibits no distension. There is no tenderness. There is no guarding.  Musculoskeletal: She exhibits no edema or tenderness.  Neurological: She is alert and oriented to person, place, and time. She has normal strength. No cranial nerve deficit or sensory deficit. Coordination normal. GCS eye subscore is 4. GCS verbal subscore is 5. GCS motor subscore is 6.  Skin: Skin is warm and dry. No rash noted. She is not diaphoretic. No erythema.  Nursing note and vitals reviewed.   ED Course  Procedures (including critical care time) Labs Review Labs Reviewed  CBC WITH DIFFERENTIAL/PLATELET - Abnormal; Notable for the following:    WBC 13.5 (*)    RBC 5.42 (*)    RDW 16.1 (*)    Platelets 559 (*)    Neutro Abs 8.7 (*)    Eosinophils Absolute 1.2 (*)    All other components within normal limits  COMPREHENSIVE METABOLIC PANEL - Abnormal; Notable for the following:    Glucose, Bld 135 (*)    All other components within normal limits    Imaging Review No results found. I have personally reviewed and evaluated these images and lab results as part of my medical decision-making.    EKG Interpretation None       CRITICAL CARE: SVT Performed by: Rhae Lerner   Total critical care time: 30 minutes  Critical care time was exclusive of separately billable procedures and treating other patients.  Critical care was necessary to treat or prevent imminent or life-threatening deterioration.  Critical care was time spent personally by me on the following activities: development of treatment plan with patient and/or surrogate as well as nursing, discussions with consultants, evaluation of patient's response to treatment, examination of patient, obtaining history from patient or surrogate, ordering and performing treatments and interventions, ordering and review of laboratory studies, ordering and review of radiographic studies, pulse oximetry and re-evaluation of patient's condition.   MDM   Final diagnoses:  None   5 old female with a history of SVT and migraines presents with concern of palpitations. Patient's heart rate 190 and arrival to the emergency department in SVT. Attempted vagal maneuvers without success.  Patient was given 6 mg of adenosine with conversion back to sinus rhythm.  EKG evaluated by me after conversion showing sinus rhythm however not present in epic. Labs overall within normal limits. Patient's chest tightness resolved after SVT resolved and feel this is likely secondary to SVT. Do not feel testing troponins in setting of SVT is indicated, and symptoms likely secondary to SVT.  Patient does admit to having caffeine today as a possible trigger. Recommended discontinuing caffeine. Patient is not on any daily medications due to not being able to tolerate this in the past. Recommend seeing a Dr. Ladona Ridgelaylor of Cardiology closely as outpt and returning to the emergency department if symptoms return.  Alvira MondayErin Emmit Oriley, MD 06/27/15 2001

## 2015-06-27 NOTE — ED Notes (Signed)
Palpitations x 2 hours. States she has a hx of SVT. She was here last week for same and it resolved itself without intervention.

## 2015-06-27 NOTE — ED Notes (Signed)
EDP at bedside  Called SR   Rate 118  Pt talking tolerated well B/P 106/96  Crash cary continues at bed side... Pads were placed.  Pt. Talking and is in no distress.  Pt. Color is wnl.  Charting by RN Earlene Plateravis

## 2016-03-06 ENCOUNTER — Encounter: Payer: Self-pay | Admitting: Gastroenterology

## 2016-03-26 ENCOUNTER — Ambulatory Visit (INDEPENDENT_AMBULATORY_CARE_PROVIDER_SITE_OTHER): Payer: BLUE CROSS/BLUE SHIELD | Admitting: Internal Medicine

## 2016-03-26 ENCOUNTER — Encounter: Payer: Self-pay | Admitting: Internal Medicine

## 2016-03-26 VITALS — BP 140/96 | HR 98 | Ht 64.0 in | Wt 290.6 lb

## 2016-03-26 DIAGNOSIS — I471 Supraventricular tachycardia: Secondary | ICD-10-CM | POA: Diagnosis not present

## 2016-03-26 LAB — CBC WITH DIFFERENTIAL/PLATELET
BASOS ABS: 76 {cells}/uL (ref 0–200)
Basophils Relative: 1 %
EOS ABS: 912 {cells}/uL — AB (ref 15–500)
EOS PCT: 12 %
HEMATOCRIT: 41 % (ref 35.0–45.0)
HEMOGLOBIN: 13.9 g/dL (ref 11.7–15.5)
LYMPHS ABS: 1368 {cells}/uL (ref 850–3900)
Lymphocytes Relative: 18 %
MCH: 29.4 pg (ref 27.0–33.0)
MCHC: 33.9 g/dL (ref 32.0–36.0)
MCV: 86.9 fL (ref 80.0–100.0)
MPV: 8.7 fL (ref 7.5–12.5)
Monocytes Absolute: 380 cells/uL (ref 200–950)
Monocytes Relative: 5 %
NEUTROS PCT: 64 %
Neutro Abs: 4864 cells/uL (ref 1500–7800)
Platelets: 346 10*3/uL (ref 140–400)
RBC: 4.72 MIL/uL (ref 3.80–5.10)
RDW: 13.5 % (ref 11.0–15.0)
WBC: 7.6 10*3/uL (ref 3.8–10.8)

## 2016-03-26 LAB — BASIC METABOLIC PANEL
BUN: 12 mg/dL (ref 7–25)
CALCIUM: 9.1 mg/dL (ref 8.6–10.2)
CO2: 24 mmol/L (ref 20–31)
CREATININE: 0.84 mg/dL (ref 0.50–1.10)
Chloride: 103 mmol/L (ref 98–110)
GLUCOSE: 94 mg/dL (ref 65–99)
Potassium: 4.3 mmol/L (ref 3.5–5.3)
SODIUM: 138 mmol/L (ref 135–146)

## 2016-03-26 LAB — HCG, SERUM, QUALITATIVE: PREG SERUM: NEGATIVE

## 2016-03-26 NOTE — Progress Notes (Signed)
HPI [name] returns today for ongoing evaluation of SVT. She is a pleasant but obese 50 yo woman with a h/o SVT dating back over 26 years. The patient notes that her episodes have increased in frequency and severity and can last up to 60 min. She can usually make them stop with valsalva maneuvers. She has never had syncope but does note sob with her episodes and chest pressure. She has tried multiple calcium channel blockers and beta blockers with no improvement. She has noted fatigue on both of these medications. She has had trouble exercising over the years and has gained weight. She lives with her mother. When I saw her 18 months ago, I recommended catheter ablation but she was not willing. She returns today to discussed ablation. Allergies  Allergen Reactions  . Iodine Nausea And Vomiting    Nausea and vomiting   . Penicillins Other (See Comments)    Childhood Reaction     Current Outpatient Prescriptions  Medication Sig Dispense Refill  . acetaminophen (TYLENOL) 500 MG tablet Take 1,000 mg by mouth every 6 (six) hours as needed. Patient used this medication for pain.    Marland Kitchen. diltiazem (CARDIZEM) 120 MG tablet Take 120 mg by mouth as needed (for SVT).    . DULoxetine (CYMBALTA) 60 MG capsule Take 120 mg by mouth daily.     . fluticasone (FLONASE) 50 MCG/ACT nasal spray Place 2 sprays into the nose daily.    Marland Kitchen. ibuprofen (ADVIL,MOTRIN) 200 MG tablet Take 600 mg by mouth every 6 (six) hours as needed. Patient used this medication for pain.    Marland Kitchen. lamoTRIgine (LAMICTAL) 200 MG tablet Take 200 mg by mouth daily.    . metoprolol tartrate (LOPRESSOR) 25 MG tablet Take 1 tablet (25 mg total) by mouth once. Take one tablet if you feel palpitations. You make take a second tablet thirty minutes later if you continue to feel rapid heart rate.  Do not take more than two tablets in a day. 20 tablet 0  . trolamine salicylate (ASPERCREME) 10 % cream Apply topically as needed. Patient used this  medication for foot and shoulder pain.    Marland Kitchen. zolmitriptan (ZOMIG) 5 MG tablet Take 5 mg by mouth as needed. Patient took 2.5 mg of this medication today.     No current facility-administered medications for this visit.      Past Medical History:  Diagnosis Date  . Depression   . Migraine   . Obesity   . SVT (supraventricular tachycardia) (HCC)     ROS:   All systems reviewed and negative except as noted in the HPI.   Past Surgical History:  Procedure Laterality Date  . ANGIOMA CAUTERY    . TOE SURGERY       Family History  Problem Relation Age of Onset  . Diabetes Mother   . Cancer Mother     bladder  . Heart attack Father   . Heart disease Father   . Urolithiasis Father      Social History   Social History  . Marital status: Single    Spouse name: N/A  . Number of children: N/A  . Years of education: N/A   Occupational History  . Not on file.   Social History Main Topics  . Smoking status: Never Smoker  . Smokeless tobacco: Never Used  . Alcohol use No  . Drug use: No  . Sexual activity: Not on file   Other Topics Concern  .  Not on file   Social History Narrative  . No narrative on file     BP (!) 140/96   Pulse 98   Ht 5\' 4"  (1.626 m)   Wt 290 lb 9.6 oz (131.8 kg)   LMP  (LMP Unknown)   SpO2 99%   BMI 49.88 kg/m   Physical Exam:  obese appearing middle aged woman, NAD HEENT: Unremarkable Neck:  6 cm JVD, no thyromegally Back:  No CVA tenderness Lungs:  Clear with no wheezes, rales, or rhonchi HEART:  Regular rate rhythm, no murmurs, no rubs, no clicks Abd:  soft, positive bowel sounds, no organomegally, no rebound, no guarding Ext:  2 plus pulses, no edema, no cyanosis, no clubbing Skin:  No rashes no nodules Neuro:  CN II through XII intact, motor grossly intact  EKG - NSR with no ventricular pre-excitation   Assess/Plan: 1. SVT - I have discussed the treatment options with the patient and she wishes to proceed with catheter  ablation. The risks/benfits/goals/expectations were reviewed with the patient. She wishes to proceed. 2. Obesity - the importance of weight loss and obesity as pertains to her ablation were discussed in detail. 3. HTN - her blood pressure is high today. Previously ok. After her ablation, might consider switching blood pressure lowering.  Leonia ReevesGregg Chrislyn Seedorf,M.D.

## 2016-03-26 NOTE — Patient Instructions (Addendum)
Medication Instructions:  Your physician recommends that you continue on your current medications as directed. Please refer to the Current Medication list given to you today.  Labs:   Your physician recommends that you return for lab work today: BMP/CBC/serum Hcg   Testing/Procedures: Your physician has recommended that you have an ablation. Catheter ablation is a medical procedure used to treat some cardiac arrhythmias (irregular heartbeats). During catheter ablation, a long, thin, flexible tube is put into a blood vessel in your groin (upper thigh), or neck. This tube is called an ablation catheter. It is then guided to your heart through the blood vessel. Radio frequency waves destroy small areas of heart tissue where abnormal heartbeats may cause an arrhythmia to start. Please see the instruction sheet given to you today.---04/12/16  [facility] at 7:30am on 04/12/16 Do not eat or drink after midnight the night prior to the procedure Do not take any medications the morning of the procedure Plan for one night stay  Follow-Up:  Your physician recommends that you schedule a follow-up appointment in 4 weeks from 04/12/16

## 2016-04-01 ENCOUNTER — Ambulatory Visit (INDEPENDENT_AMBULATORY_CARE_PROVIDER_SITE_OTHER): Payer: BLUE CROSS/BLUE SHIELD | Admitting: Orthopedic Surgery

## 2016-04-01 DIAGNOSIS — M25561 Pain in right knee: Secondary | ICD-10-CM

## 2016-04-04 ENCOUNTER — Other Ambulatory Visit (INDEPENDENT_AMBULATORY_CARE_PROVIDER_SITE_OTHER): Payer: Self-pay | Admitting: Orthopedic Surgery

## 2016-04-04 DIAGNOSIS — M25561 Pain in right knee: Secondary | ICD-10-CM

## 2016-04-11 ENCOUNTER — Ambulatory Visit: Payer: Self-pay | Admitting: Internal Medicine

## 2016-04-12 ENCOUNTER — Encounter (HOSPITAL_COMMUNITY)
Admission: RE | Disposition: A | Discharge status: Home / Self Care | Payer: Self-pay | Source: Ambulatory Visit | Attending: Internal Medicine

## 2016-04-12 ENCOUNTER — Ambulatory Visit (HOSPITAL_COMMUNITY)
Admission: RE | Admit: 2016-04-12 | Discharge: 2016-04-12 | Disposition: A | Discharge status: Home / Self Care | Payer: BLUE CROSS/BLUE SHIELD | Source: Ambulatory Visit | Attending: Internal Medicine | Admitting: Internal Medicine

## 2016-04-12 ENCOUNTER — Encounter (HOSPITAL_COMMUNITY): Payer: Self-pay | Admitting: Internal Medicine

## 2016-04-12 DIAGNOSIS — F329 Major depressive disorder, single episode, unspecified: Secondary | ICD-10-CM | POA: Insufficient documentation

## 2016-04-12 DIAGNOSIS — Z841 Family history of disorders of kidney and ureter: Secondary | ICD-10-CM | POA: Insufficient documentation

## 2016-04-12 DIAGNOSIS — Z7951 Long term (current) use of inhaled steroids: Secondary | ICD-10-CM | POA: Insufficient documentation

## 2016-04-12 DIAGNOSIS — I10 Essential (primary) hypertension: Secondary | ICD-10-CM | POA: Diagnosis not present

## 2016-04-12 DIAGNOSIS — Z888 Allergy status to other drugs, medicaments and biological substances status: Secondary | ICD-10-CM | POA: Insufficient documentation

## 2016-04-12 DIAGNOSIS — Z9889 Other specified postprocedural states: Secondary | ICD-10-CM | POA: Diagnosis not present

## 2016-04-12 DIAGNOSIS — I471 Supraventricular tachycardia, unspecified: Secondary | ICD-10-CM | POA: Diagnosis present

## 2016-04-12 DIAGNOSIS — Z880 Allergy status to penicillin: Secondary | ICD-10-CM | POA: Diagnosis not present

## 2016-04-12 DIAGNOSIS — Z791 Long term (current) use of non-steroidal anti-inflammatories (NSAID): Secondary | ICD-10-CM | POA: Insufficient documentation

## 2016-04-12 DIAGNOSIS — Z8052 Family history of malignant neoplasm of bladder: Secondary | ICD-10-CM | POA: Insufficient documentation

## 2016-04-12 DIAGNOSIS — Z833 Family history of diabetes mellitus: Secondary | ICD-10-CM | POA: Diagnosis not present

## 2016-04-12 DIAGNOSIS — Z8249 Family history of ischemic heart disease and other diseases of the circulatory system: Secondary | ICD-10-CM | POA: Diagnosis not present

## 2016-04-12 DIAGNOSIS — G43909 Migraine, unspecified, not intractable, without status migrainosus: Secondary | ICD-10-CM | POA: Insufficient documentation

## 2016-04-12 DIAGNOSIS — Z79899 Other long term (current) drug therapy: Secondary | ICD-10-CM | POA: Diagnosis not present

## 2016-04-12 DIAGNOSIS — Z6841 Body Mass Index (BMI) 40.0 and over, adult: Secondary | ICD-10-CM | POA: Diagnosis not present

## 2016-04-12 HISTORY — DX: Other chronic pain: G89.29

## 2016-04-12 HISTORY — PX: ELECTROPHYSIOLOGIC STUDY: SHX172A

## 2016-04-12 HISTORY — DX: Essential (primary) hypertension: I10

## 2016-04-12 HISTORY — DX: Gastro-esophageal reflux disease without esophagitis: K21.9

## 2016-04-12 HISTORY — DX: Low back pain: M54.5

## 2016-04-12 HISTORY — DX: Unspecified osteoarthritis, unspecified site: M19.90

## 2016-04-12 HISTORY — DX: Low back pain, unspecified: M54.50

## 2016-04-12 LAB — PREGNANCY, URINE: Preg Test, Ur: NEGATIVE

## 2016-04-12 SURGERY — A-FLUTTER/A-TACH/SVT ABLATION

## 2016-04-12 MED ORDER — MIDAZOLAM HCL 5 MG/5ML IJ SOLN
INTRAMUSCULAR | Status: AC
  Filled 2016-04-12: qty 5

## 2016-04-12 MED ORDER — FENTANYL CITRATE (PF) 100 MCG/2ML IJ SOLN
INTRAMUSCULAR | Status: AC
  Filled 2016-04-12: qty 2

## 2016-04-12 MED ORDER — ISOPROTERENOL HCL 0.2 MG/ML IJ SOLN
INTRAVENOUS | Status: DC | PRN
  Administered 2016-04-12: 2 ug/min via INTRAVENOUS

## 2016-04-12 MED ORDER — SODIUM CHLORIDE 0.9 % IV SOLN: INTRAVENOUS | Status: DC

## 2016-04-12 MED ORDER — INFLUENZA VAC SPLIT QUAD 0.5 ML IM SUSY: 0.5000 mL | PREFILLED_SYRINGE | INTRAMUSCULAR | Status: DC

## 2016-04-12 MED ORDER — ACETAMINOPHEN 500 MG PO TABS: 1000.0000 mg | ORAL_TABLET | Freq: Four times a day (QID) | ORAL | Status: DC | PRN

## 2016-04-12 MED ORDER — ONDANSETRON HCL 4 MG/2ML IJ SOLN
INTRAMUSCULAR | Status: AC
  Filled 2016-04-12: qty 2

## 2016-04-12 MED ORDER — IBUPROFEN 600 MG PO TABS: 600.0000 mg | ORAL_TABLET | Freq: Four times a day (QID) | ORAL | Status: DC | PRN

## 2016-04-12 MED ORDER — BUPIVACAINE HCL (PF) 0.25 % IJ SOLN
INTRAMUSCULAR | Status: DC | PRN
  Administered 2016-04-12: 50 mL

## 2016-04-12 MED ORDER — SODIUM CHLORIDE 0.9% FLUSH
3.0000 mL | Freq: Two times a day (BID) | INTRAVENOUS | Status: DC
  Administered 2016-04-12: 3 mL via INTRAVENOUS

## 2016-04-12 MED ORDER — ONDANSETRON HCL 4 MG/2ML IJ SOLN
INTRAMUSCULAR | Status: AC
Start: 2016-04-12 — End: 2016-04-12
  Filled 2016-04-12: qty 2

## 2016-04-12 MED ORDER — SODIUM CHLORIDE 0.9 % IV SOLN: 250.0000 mL | INTRAVENOUS | Status: DC | PRN

## 2016-04-12 MED ORDER — LAMOTRIGINE 100 MG PO TABS
400.0000 mg | ORAL_TABLET | Freq: Every day | ORAL | Status: DC
  Filled 2016-04-12: qty 4

## 2016-04-12 MED ORDER — ONDANSETRON HCL 4 MG/2ML IJ SOLN
INTRAMUSCULAR | Status: DC | PRN
  Administered 2016-04-12 (×2): 2 mg via INTRAVENOUS

## 2016-04-12 MED ORDER — ACETAMINOPHEN 325 MG PO TABS
650.0000 mg | ORAL_TABLET | ORAL | Status: DC | PRN
  Administered 2016-04-12: 650 mg via ORAL

## 2016-04-12 MED ORDER — TOPIRAMATE 100 MG PO TABS: 50.0000 mg | ORAL_TABLET | Freq: Every day | ORAL | Status: DC

## 2016-04-12 MED ORDER — HEPARIN (PORCINE) IN NACL 2-0.9 UNIT/ML-% IJ SOLN
INTRAMUSCULAR | Status: DC | PRN
  Administered 2016-04-12: 500 mL

## 2016-04-12 MED ORDER — INFLUENZA VAC SPLIT QUAD 0.5 ML IM SUSY
0.5000 mL | PREFILLED_SYRINGE | Freq: Once | INTRAMUSCULAR | Status: AC
  Administered 2016-04-12: 0.5 mL via INTRAMUSCULAR

## 2016-04-12 MED ORDER — ISOPROTERENOL HCL 0.2 MG/ML IJ SOLN
INTRAMUSCULAR | Status: AC
  Filled 2016-04-12: qty 5

## 2016-04-12 MED ORDER — MIDAZOLAM HCL 5 MG/5ML IJ SOLN
INTRAMUSCULAR | Status: DC | PRN
  Administered 2016-04-12: 2 mg via INTRAVENOUS
  Administered 2016-04-12: 1 mg via INTRAVENOUS
  Administered 2016-04-12 (×3): 2 mg via INTRAVENOUS
  Administered 2016-04-12: 1 mg via INTRAVENOUS

## 2016-04-12 MED ORDER — FENTANYL CITRATE (PF) 100 MCG/2ML IJ SOLN
INTRAMUSCULAR | Status: DC | PRN
  Administered 2016-04-12 (×5): 25 ug via INTRAVENOUS

## 2016-04-12 MED ORDER — SODIUM CHLORIDE 0.9% FLUSH: 3.0000 mL | INTRAVENOUS | Status: DC | PRN

## 2016-04-12 MED ORDER — METOPROLOL TARTRATE 25 MG PO TABS: 25.0000 mg | ORAL_TABLET | Freq: Once | ORAL | Status: DC

## 2016-04-12 MED ORDER — ONDANSETRON HCL 4 MG/2ML IJ SOLN: 4.0000 mg | Freq: Four times a day (QID) | INTRAMUSCULAR | Status: DC | PRN

## 2016-04-12 MED ORDER — DULOXETINE HCL 60 MG PO CPEP
120.0000 mg | ORAL_CAPSULE | Freq: Every evening | ORAL | Status: DC
  Filled 2016-04-12: qty 2

## 2016-04-12 MED ORDER — TOPIRAMATE 25 MG PO CPSP: 50.0000 mg | ORAL_CAPSULE | Freq: Every day | ORAL | Status: DC

## 2016-04-12 MED ORDER — ACETAMINOPHEN 325 MG PO TABS
ORAL_TABLET | ORAL | Status: AC
  Filled 2016-04-12: qty 2

## 2016-04-12 SURGICAL SUPPLY — 11 items
BAG SNAP BAND KOVER 36X36 (MISCELLANEOUS) ×2 IMPLANT
CATH CELSIUS THERM D CV 7F (ABLATOR) ×2 IMPLANT
CATH HEX JOS 2-5-2 65CM 6F REP (CATHETERS) ×2 IMPLANT
CATH JOSEPHSON QUAD-ALLRED 6FR (CATHETERS) ×4 IMPLANT
PACK EP LATEX FREE (CUSTOM PROCEDURE TRAY) ×3
PACK EP LF (CUSTOM PROCEDURE TRAY) IMPLANT
PAD DEFIB LIFELINK (PAD) ×2 IMPLANT
SHEATH PINNACLE 6F 10CM (SHEATH) ×4 IMPLANT
SHEATH PINNACLE 7F 10CM (SHEATH) ×2 IMPLANT
SHEATH PINNACLE 8F 10CM (SHEATH) ×2 IMPLANT
SHIELD RADPAD SCOOP 12X17 (MISCELLANEOUS) ×2 IMPLANT

## 2016-04-12 NOTE — Discharge Summary (Signed)
Discharge Summary    Patient ID: Jennifer Howard,  MRN: [id number], DOB/AGE: Jan 20, 1966 50 y.o.  Admit date: April 25, 2016 Discharge date: 2016/04/25  Primary Care Provider: PROVIDER NOT IN SYSTEM Primary Cardiologist: Dr. Ladona Ridgel   Discharge Diagnoses    Principal Problem:   SVT (supraventricular tachycardia) (HCC) Active Problems:   Morbid obesity (HCC)   HTN (hypertension)   Allergies Allergies  Allergen Reactions  . Prednisone Other (See Comments)    Sensitivity, migraines   . Iodine Nausea And Vomiting    Nausea and vomiting, post arteriogram   . Penicillins Other (See Comments)    Childhood Reaction Has patient had a PCN reaction causing immediate rash, facial/tongue/throat swelling, SOB or lightheadedness with hypotension: Unknown Has patient had a PCN reaction causing severe rash involving mucus membranes or skin necrosis: Unknown Has patient had a PCN reaction that required hospitalization Unknown Has patient had a PCN reaction occurring within the last 10 years: No If all of the above answers are "NO", then may proceed with Cephalosporin use.      History of Present Illness     Jennifer Howard is a 50 y.o. female with a history of obesity, migraines, HTN and SVT who presented to [facility] today for planned SVT ablation with Dr. Ladona Ridgel.    She is a pleasant but obese 50 yo woman with a h/o SVT dating back over 26 years. The patient notes that her episodes have increased in frequency and severity and can last up to 60 min. She can usually make them stop with valsalva maneuvers. She has never had syncope but does note sob with her episodes and chest pressure. She has tried multiple calcium channel blockers and beta blockers with no improvement. She has noted fatigue on both of these medications. She has had trouble exercising over the years and has gained weight. She lives with her mother. She was recently seen in the office for discussion of RFCA ablation which was scheduled  for 04/25/16.  Hospital Course     Consultants: none  1. SVT: she is now s/p successful RFCA  2. Obesity: Body mass index is 48.9 kg/m. Diet and exercise recommended.  3. HTN: continue Lopressor 25mg  BID   The patient has had an uncomplicated hospital course and is recovering well. The femoral catheter site is stable. She has been seen by Dr. Ladona Ridgel today and deemed ready for discharge home. All follow-up appointments have been scheduled.  Discharge medications are listed below.  _____________  Discharge Vitals Blood pressure 133/90, pulse 89, temperature 97.8 F (36.6 C), temperature source Oral, resp. rate 16, height 5\' 5"  (1.651 m), weight 293 lb 14 oz (133.3 kg), SpO2 100 %.  Filed Weights   2016/04/25 0741 April 25, 2016 1408  Weight: 290 lb (131.5 kg) 293 lb 14 oz (133.3 kg)    Labs & Radiologic Studies    none  Diagnostic Studies/Procedures    Procedures 04/25/2016 SVT Ablation  Conclusion   CONCLUSIONS:  1. Sinus rhythm upon presentation.  2. 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 Successful radiofrequency modification of the slow AV nodal pathway  4. No inducible arrhythmias following ablation.  5. No early apparent complications.   Amrutha Avera,M.D.    _____________    Disposition   Pt is being discharged home today in good condition.  Follow-up Plans & Appointments    Follow-up Information  Lewayne BuntingGregg Brooklyn Alfredo, MD. Go on 05/10/2016.   Specialty:  Cardiology Why:  @ 9:15 am Contact information: [address] [place] [address] [phone]            Discharge Medications     Medication List    TAKE these medications   acetaminophen 500 MG tablet Commonly known as:  TYLENOL Take 1,000 mg by mouth every 6 (six) hours as needed for mild pain.   chlorpheniramine 4 MG tablet Commonly  known as:  CHLOR-TRIMETON Take 4 mg by mouth daily as needed for allergies.   DULoxetine 60 MG capsule Commonly known as:  CYMBALTA Take 120 mg by mouth every evening.   fluticasone 50 MCG/ACT nasal spray Commonly known as:  FLONASE Place 2 sprays into the nose daily as needed for allergies.   ibuprofen 200 MG tablet Commonly known as:  ADVIL,MOTRIN Take 600 mg by mouth every 6 (six) hours as needed for moderate pain. Patient used this medication for pain.   lamoTRIgine 200 MG tablet Commonly known as:  LAMICTAL Take 400 mg by mouth daily.   lidocaine 5 % Commonly known as:  LIDODERM Place 1 patch onto the skin daily. Remove & Discard patch within 12 hours or as directed by MD   metoprolol tartrate 25 MG tablet Commonly known as:  LOPRESSOR Take 1 tablet (25 mg total) by mouth once. Take one tablet if you feel palpitations. You make take a second tablet thirty minutes later if you continue to feel rapid heart rate.  Do not take more than two tablets in a day.   topiramate 25 MG capsule Commonly known as:  TOPAMAX Take 50 mg by mouth at bedtime.   trolamine salicylate 10 % cream Commonly known as:  ASPERCREME Apply 1 application topically as needed for muscle pain. Patient used this medication for foot and shoulder pain.   zolmitriptan 5 MG tablet Commonly known as:  ZOMIG Take 2.5-5 mg by mouth as needed for migraine.          Outstanding Labs/Studies   None.   Duration of Discharge Encounter   Greater than 30 minutes including physician time.  Signed, Cline CrockKathryn Thompson PA-C 04/12/2016, 3:15 PM  EP Attending  Patient seen and examined. Agree with above. She is stable after EP study and catheter ablation. Plan early DC.  Leonia ReevesGregg Desi Carby,M.D.

## 2016-04-12 NOTE — Discharge Instructions (Signed)

## 2016-04-12 NOTE — H&P (View-Only) (Signed)
HPI Jennifer Howard returns today for ongoing evaluation of SVT. She is a pleasant but obese 50 yo woman with a h/o SVT dating back over 26 years. The patient notes that her episodes have increased in frequency and severity and can last up to 60 min. She can usually make them stop with valsalva maneuvers. She has never had syncope but does note sob with her episodes and chest pressure. She has tried multiple calcium channel blockers and beta blockers with no improvement. She has noted fatigue on both of these medications. She has had trouble exercising over the years and has gained weight. She lives with her mother. When I saw her 18 months ago, I recommended catheter ablation but she was not willing. She returns today to discussed ablation. Allergies  Allergen Reactions  . Iodine Nausea And Vomiting    Nausea and vomiting   . Penicillins Other (See Comments)    Childhood Reaction     Current Outpatient Prescriptions  Medication Sig Dispense Refill  . acetaminophen (TYLENOL) 500 MG tablet Take 1,000 mg by mouth every 6 (six) hours as needed. Patient used this medication for pain.    Marland Kitchen. diltiazem (CARDIZEM) 120 MG tablet Take 120 mg by mouth as needed (for SVT).    . DULoxetine (CYMBALTA) 60 MG capsule Take 120 mg by mouth daily.     . fluticasone (FLONASE) 50 MCG/ACT nasal spray Place 2 sprays into the nose daily.    Marland Kitchen. ibuprofen (ADVIL,MOTRIN) 200 MG tablet Take 600 mg by mouth every 6 (six) hours as needed. Patient used this medication for pain.    Marland Kitchen. lamoTRIgine (LAMICTAL) 200 MG tablet Take 200 mg by mouth daily.    . metoprolol tartrate (LOPRESSOR) 25 MG tablet Take 1 tablet (25 mg total) by mouth once. Take one tablet if you feel palpitations. You make take a second tablet thirty minutes later if you continue to feel rapid heart rate.  Do not take more than two tablets in a day. 20 tablet 0  . trolamine salicylate (ASPERCREME) 10 % cream Apply topically as needed. Patient used this  medication for foot and shoulder pain.    Marland Kitchen. zolmitriptan (ZOMIG) 5 MG tablet Take 5 mg by mouth as needed. Patient took 2.5 mg of this medication today.     No current facility-administered medications for this visit.      Past Medical History:  Diagnosis Date  . Depression   . Migraine   . Obesity   . SVT (supraventricular tachycardia) (HCC)     ROS:   All systems reviewed and negative except as noted in the HPI.   Past Surgical History:  Procedure Laterality Date  . ANGIOMA CAUTERY    . TOE SURGERY       Family History  Problem Relation Age of Onset  . Diabetes Mother   . Cancer Mother     bladder  . Heart attack Father   . Heart disease Father   . Urolithiasis Father      Social History   Social History  . Marital status: Single    Spouse name: N/A  . Number of children: N/A  . Years of education: N/A   Occupational History  . Not on file.   Social History Main Topics  . Smoking status: Never Smoker  . Smokeless tobacco: Never Used  . Alcohol use No  . Drug use: No  . Sexual activity: Not on file   Other Topics Concern  .  Not on file   Social History Narrative  . No narrative on file     BP (!) 140/96   Pulse 98   Ht 5\' 4"  (1.626 m)   Wt 290 lb 9.6 oz (131.8 kg)   LMP  (LMP Unknown)   SpO2 99%   BMI 49.88 kg/m   Physical Exam:  obese appearing middle aged woman, NAD HEENT: Unremarkable Neck:  6 cm JVD, no thyromegally Back:  No CVA tenderness Lungs:  Clear with no wheezes, rales, or rhonchi HEART:  Regular rate rhythm, no murmurs, no rubs, no clicks Abd:  soft, positive bowel sounds, no organomegally, no rebound, no guarding Ext:  2 plus pulses, no edema, no cyanosis, no clubbing Skin:  No rashes no nodules Neuro:  CN II through XII intact, motor grossly intact  EKG - NSR with no ventricular pre-excitation   Assess/Plan: 1. SVT - I have discussed the treatment options with the patient and she wishes to proceed with catheter  ablation. The risks/benfits/goals/expectations were reviewed with the patient. She wishes to proceed. 2. Obesity - the importance of weight loss and obesity as pertains to her ablation were discussed in detail. 3. HTN - her blood pressure is high today. Previously ok. After her ablation, might consider switching blood pressure lowering.  Leonia ReevesGregg Lysander Calixte,M.D.

## 2016-04-12 NOTE — Progress Notes (Signed)
Site area: RFV x 3 Site Prior to Removal:  Level 0 Pressure Applied For: 20 min Manual:   yes Patient Status During Pull:  stable Post Pull Site:  Level 0 Post Pull Instructions Given:  yes Post Pull Pulses Present: palpable Dressing Applied:  tegaderm Bedrest begins @ 1230 till 1830 Comments:By Justice RocherAlicia Stewart

## 2016-04-12 NOTE — Interval H&P Note (Signed)
History and Physical Interval Note:  04/12/2016 9:42 AM  Jennifer Howard  has presented today for surgery, with the diagnosis of svt  The various methods of treatment have been discussed with the patient and family. After consideration of risks, benefits and other options for treatment, the patient has consented to  Procedure(s): SVT Ablation (N/A) as a surgical intervention .  The patient's history has been reviewed, patient examined, no change in status, stable for surgery.  I have reviewed the patient's chart and labs.  Questions were answered to the patient's satisfaction.     Lewayne BuntingGregg Taylor

## 2016-04-19 ENCOUNTER — Ambulatory Visit (INDEPENDENT_AMBULATORY_CARE_PROVIDER_SITE_OTHER): Payer: BLUE CROSS/BLUE SHIELD | Admitting: Orthopedic Surgery

## 2016-04-20 ENCOUNTER — Ambulatory Visit
Admission: RE | Admit: 2016-04-20 | Discharge: 2016-04-20 | Disposition: A | Discharge status: Home / Self Care | Payer: BLUE CROSS/BLUE SHIELD | Source: Ambulatory Visit | Attending: Orthopedic Surgery | Admitting: Orthopedic Surgery

## 2016-04-20 DIAGNOSIS — M25561 Pain in right knee: Secondary | ICD-10-CM

## 2016-05-01 ENCOUNTER — Encounter (INDEPENDENT_AMBULATORY_CARE_PROVIDER_SITE_OTHER): Payer: Self-pay | Admitting: Orthopedic Surgery

## 2016-05-01 ENCOUNTER — Ambulatory Visit (INDEPENDENT_AMBULATORY_CARE_PROVIDER_SITE_OTHER): Payer: BLUE CROSS/BLUE SHIELD | Admitting: Orthopedic Surgery

## 2016-05-01 DIAGNOSIS — M94261 Chondromalacia, right knee: Secondary | ICD-10-CM | POA: Diagnosis not present

## 2016-05-02 NOTE — Progress Notes (Signed)
Office Visit Note   Patient: Jennifer Howard           Date of Birth: 04/06/1966           MRN: [id number] Visit Date: 05/01/2016 Requested by: No referring provider defined for this encounter. PCP: [name] Patient  Subjective: Chief Complaint  Patient presents with  . Right Knee - Pain, Follow-up    HPI Jennifer Custardaron is a 50 year old female with right knee pain.  Since I seen her she's had an MRI scan which is reviewed today.  It shows predominantly lateral compartment arthritis as well as patellofemoral arthritis.  She's having mostly medial sided pain.  MRI scan on the medial side shows intact articular cartilage and meniscus.  In general her symptoms have been more manageable.  She has been taking medication for the problem.  Stairs are bed sometimes.              Review of Systems All systems reviewed are negative as they relate to the chief complaint within the history of present illness.  Patient denies  fevers or chills.    Assessment & Plan: Visit Diagnoses:  1. Chondromalacia of knee, right     Plan: Impression is right knee pain with degenerative changes in the lateral compartment patellofemoral compartment.  Nothing definitively operative in the knee at this time.  I think that nonweightbearing quad strengthening exercises and weight loss or paramount for the long-term health of her knee.  All of this is discussed.  I will see her back as needed.  Injection will be the next step if her symptoms recur  Follow-Up Instructions: Return if symptoms worsen or fail to improve.   Orders:  No orders of the defined types were placed in this encounter.  No orders of the defined types were placed in this encounter.     Procedures: No procedures performed   Clinical Data: No additional findings.  Objective: Vital Signs: There were no vitals taken for this visit.  Physical Exam  Constitutional: She appears well-developed.  HENT:  Head: Normocephalic.  Eyes: EOM are normal.    Neck: Normal range of motion.  Cardiovascular: Normal rate.   Pulmonary/Chest: Effort normal.  Neurological: She is alert.  Skin: Skin is warm.  Psychiatric: She has a normal mood and affect.    Ortho Exam examination demonstrates increased body mass index fairly normal alignment palpable pedal pulses she does not have collapse of the midfoot knee has full range of motion on the right with intact extensor mechanism stable collateral crucial ligaments mild patellofemoral prep crepitus worse on the right than the left no effusion is present on the right or left hand side  Specialty Comments:  No specialty comments available.  Imaging: No results found.   PMFS History: Patient Active Problem List   Diagnosis Date Noted  . SVT (supraventricular tachycardia) (HCC) 04/12/2016  . HTN (hypertension)   . Morbid obesity (HCC) 10/14/2014   Past Medical History:  Diagnosis Date  . Arthritis    "little; in my back, right foot" (04/12/2016)  . Chronic lower back pain   . Depression   . GERD (gastroesophageal reflux disease)   . HTN (hypertension)   . Migraine    "twice/week" (04/12/2016)  . Obesity   . SVT (supraventricular tachycardia) (HCC)    a. s/p RFCA on 04/12/16    Family History  Problem Relation Age of Onset  . Diabetes Mother   . Cancer Mother     bladder  .  Heart attack Father   . Heart disease Father   . Urolithiasis Father     Past Surgical History:  Procedure Laterality Date  . ELECTROPHYSIOLOGIC STUDY N/A 04/12/2016   Procedure: SVT Ablation;  Surgeon: Jennifer MawGregg W Taylor, MD;  [facility];  Service: Cardiovascular;  Laterality: N/A;  . HEMANGIOMA EXCISION  1972 - 1990   "right breast"  . REDUCTION MAMMAPLASTY Left 1990   "to make it the same size as the right"  . TOE SURGERY Right ~ 1985   "removal of tumor in toe"   Social History   Occupational History  . Not on file.   Social History Main Topics  . Smoking status: Never Smoker  .  Smokeless tobacco: Never Used  . Alcohol use No  . Drug use: No  . Sexual activity: No

## 2016-05-10 ENCOUNTER — Ambulatory Visit (INDEPENDENT_AMBULATORY_CARE_PROVIDER_SITE_OTHER): Payer: BLUE CROSS/BLUE SHIELD | Admitting: Internal Medicine

## 2016-05-10 ENCOUNTER — Encounter: Payer: Self-pay | Admitting: Internal Medicine

## 2016-05-10 VITALS — BP 130/80 | HR 82 | Ht 64.0 in | Wt 292.0 lb

## 2016-05-10 DIAGNOSIS — I471 Supraventricular tachycardia: Secondary | ICD-10-CM | POA: Diagnosis not present

## 2016-05-10 NOTE — Patient Instructions (Addendum)
Medication Instructions:  Your physician recommends that you continue on your current medications as directed. Please refer to the Current Medication list given to you today.   Labwork: None Ordered   Testing/Procedures: None Ordered   Follow-Up: Follow-up with Dr. Taylor as needed    Any Other Special Instructions Will Be Listed Below (If Applicable).     If you need a refill on your cardiac medications before your next appointment, please call your pharmacy.   

## 2016-05-10 NOTE — Progress Notes (Signed)
HPI [name] returns today for ongoing evaluation of SVT. She is a pleasant but obese 50 yo woman with a h/o SVT dating back over 26 years. The patient notes that her episodes have increased in frequency and severity and can last up to 60 min. She can usually make them stop with valsalva maneuvers. She has never had syncope but does note sob with her episodes and chest pressure. She has tried multiple calcium channel blockers and beta blockers with no improvement. She has noted fatigue on both of these medications. She has had trouble exercising over the years and has gained weight. She lives with her mother. When I saw her 18 months ago, I recommended catheter ablation but she was not willing. She returns today to discussed ablation. Allergies  Allergen Reactions  . Prednisone Other (See Comments)    Sensitivity, migraines   . Iodine Nausea And Vomiting    Nausea and vomiting, post arteriogram   . Penicillins Other (See Comments)    Childhood Reaction Has patient had a PCN reaction causing immediate rash, facial/tongue/throat swelling, SOB or lightheadedness with hypotension: Unknown Has patient had a PCN reaction causing severe rash involving mucus membranes or skin necrosis: Unknown Has patient had a PCN reaction that required hospitalization Unknown Has patient had a PCN reaction occurring within the last 10 years: No If all of the above answers are "NO", then may proceed with Cephalosporin use.      Current Outpatient Prescriptions  Medication Sig Dispense Refill  . acetaminophen (TYLENOL) 500 MG tablet Take 1,000 mg by mouth every 6 (six) hours as needed for mild pain.     . chlorpheniramine (CHLOR-TRIMETON) 4 MG tablet Take 4 mg by mouth daily as needed for allergies.    . DULoxetine (CYMBALTA) 60 MG capsule Take 120 mg by mouth every evening.     . fluticasone (FLONASE) 50 MCG/ACT nasal spray Place 2 sprays into the nose daily as needed for allergies.     Marland Kitchen. ibuprofen  (ADVIL,MOTRIN) 200 MG tablet Take 600 mg by mouth every 6 (six) hours as needed for moderate pain. Patient used this medication for pain.     Marland Kitchen. lamoTRIgine (LAMICTAL) 200 MG tablet Take 400 mg by mouth daily.    Marland Kitchen. lidocaine (LIDODERM) 5 % Place 1 patch onto the skin daily. Remove & Discard patch within 12 hours or as directed by MD    . topiramate (TOPAMAX) 25 MG capsule Take 50 mg by mouth at bedtime.    . trolamine salicylate (ASPERCREME) 10 % cream Apply 1 application topically as needed for muscle pain. Patient used this medication for foot and shoulder pain.     Marland Kitchen. zolmitriptan (ZOMIG) 5 MG tablet Take 2.5-5 mg by mouth as needed for migraine.      No current facility-administered medications for this visit.      Past Medical History:  Diagnosis Date  . Arthritis    "little; in my back, right foot" (04/12/2016)  . Chronic lower back pain   . Depression   . GERD (gastroesophageal reflux disease)   . HTN (hypertension)   . Migraine    "twice/week" (04/12/2016)  . Obesity   . SVT (supraventricular tachycardia) (HCC)    a. s/p RFCA on 04/12/16    ROS:   All systems reviewed and negative except as noted in the HPI.   Past Surgical History:  Procedure Laterality Date  . ELECTROPHYSIOLOGIC STUDY N/A 04/12/2016   Procedure: SVT Ablation;  Surgeon: Marinus MawGregg W Gevorg Brum, MD;  [facility];  Service: Cardiovascular;  Laterality: N/A;  . HEMANGIOMA EXCISION  1972 - 1990   "right breast"  . REDUCTION MAMMAPLASTY Left 1990   "to make it the same size as the right"  . TOE SURGERY Right ~ 1985   "removal of tumor in toe"     Family History  Problem Relation Age of Onset  . Diabetes Mother   . Cancer Mother     bladder  . Heart attack Father   . Heart disease Father   . Urolithiasis Father      Social History   Social History  . Marital status: Single    Spouse name: N/A  . Number of children: N/A  . Years of education: N/A   Occupational History  . Not on  file.   Social History Main Topics  . Smoking status: Never Smoker  . Smokeless tobacco: Never Used  . Alcohol use No  . Drug use: No  . Sexual activity: No   Other Topics Concern  . Not on file   Social History Narrative  . No narrative on file     BP 130/80   Pulse 82   Ht 5\' 4"  (1.626 m)   Wt 292 lb (132.5 kg)   SpO2 98%   BMI 50.12 kg/m   Physical Exam:  obese appearing middle aged woman, NAD HEENT: Unremarkable Neck:  6 cm JVD, no thyromegally Back:  No CVA tenderness Lungs:  Clear with no wheezes, rales, or rhonchi HEART:  Regular rate rhythm, no murmurs, no rubs, no clicks Abd:  soft, positive bowel sounds, no organomegally, no rebound, no guarding Ext:  2 plus pulses, no edema, no cyanosis, no clubbing Skin:  No rashes no nodules Neuro:  CN II through XII intact, motor grossly intact  EKG - NSR with no ventricular pre-excitation   Assess/Plan: 1. SVT - she is s/p catheter ablation and doing well. No recurrent SVT. She will undergo watchful waiting. 2. Obesity - the importance of weight loss and obesity as pertains to her ablation were discussed in detail. 3. HTN - her blood pressure is a little high today. She is encouraged to lose weight.  Leonia ReevesGregg Dahlia Nifong,M.D.

## 2016-05-20 ENCOUNTER — Ambulatory Visit (INDEPENDENT_AMBULATORY_CARE_PROVIDER_SITE_OTHER): Payer: BLUE CROSS/BLUE SHIELD | Admitting: Gastroenterology

## 2016-05-20 ENCOUNTER — Encounter: Payer: Self-pay | Admitting: Gastroenterology

## 2016-05-20 VITALS — BP 128/84 | HR 78 | Ht 64.0 in | Wt 290.0 lb

## 2016-05-20 DIAGNOSIS — R143 Flatulence: Secondary | ICD-10-CM

## 2016-05-20 DIAGNOSIS — R140 Abdominal distension (gaseous): Secondary | ICD-10-CM

## 2016-05-20 DIAGNOSIS — K588 Other irritable bowel syndrome: Secondary | ICD-10-CM | POA: Diagnosis not present

## 2016-05-20 DIAGNOSIS — R197 Diarrhea, unspecified: Secondary | ICD-10-CM

## 2016-05-20 DIAGNOSIS — K5900 Constipation, unspecified: Secondary | ICD-10-CM | POA: Diagnosis not present

## 2016-05-20 NOTE — Progress Notes (Signed)
Jennifer Howard    [id number]    September 06, 1965  Primary Care Physician:[name] Patient  Referring Physician: No referring provider defined for this encounter.  Chief complaint: Constipation, diarrhea, gas, bloating  HPI: 50 yr obese F with h/o SVT s/p ablation is here with c/o alternating constipation and diarrhea. Patient also complains of excessive bloating and flatus . Last month she had an episode of severe constipation and she took Prune X with improvement. She does take intermittent Imodium if needed if she has more than 3 or 4 bowel movements a day and she alternates with constipation with no bowel movement for 3-4 days . She has bloating and excessive gas.She is concerned that she is continuing to gain weight Denies any nausea, vomiting, abdominal pain, melena or bright red blood per rectum. No family history of colon cancer    Outpatient Encounter Prescriptions as of 05/20/2016  Medication Sig  . acetaminophen (TYLENOL) 500 MG tablet Take 1,000 mg by mouth every 6 (six) hours as needed for mild pain.   . chlorpheniramine (CHLOR-TRIMETON) 4 MG tablet Take 4 mg by mouth daily as needed for allergies.  . DULoxetine (CYMBALTA) 60 MG capsule Take 120 mg by mouth every evening.   . fluticasone (FLONASE) 50 MCG/ACT nasal spray Place 2 sprays into the nose daily as needed for allergies.   Marland Kitchen. ibuprofen (ADVIL,MOTRIN) 200 MG tablet Take 600 mg by mouth every 6 (six) hours as needed for moderate pain. Patient used this medication for pain.   Marland Kitchen. lamoTRIgine (LAMICTAL) 200 MG tablet Take 400 mg by mouth daily.  Marland Kitchen. lidocaine (LIDODERM) 5 % Place 1 patch onto the skin daily. Remove & Discard patch within 12 hours or as directed by MD  . topiramate (TOPAMAX) 25 MG capsule Take 50 mg by mouth at bedtime.  . trolamine salicylate (ASPERCREME) 10 % cream Apply 1 application topically as needed for muscle pain. Patient used this medication for foot and shoulder pain.   Marland Kitchen. zolmitriptan (ZOMIG) 5  MG tablet Take 2.5-5 mg by mouth as needed for migraine.    No facility-administered encounter medications on file as of 05/20/2016.     Allergies as of 05/20/2016 - Review Complete 05/20/2016  Allergen Reaction Noted  . Prednisone Other (See Comments) 11/14/2015  . Iodine Nausea And Vomiting 08/29/2013  . Penicillins Other (See Comments) 08/28/2011    Past Medical History:  Diagnosis Date  . Arthritis    "little; in my back, right foot" (04/12/2016)  . Chronic lower back pain   . Depression   . GERD (gastroesophageal reflux disease)   . HTN (hypertension)   . Migraine    "twice/week" (04/12/2016)  . Obesity   . SVT (supraventricular tachycardia) (HCC)    a. s/p RFCA on 04/12/16    Past Surgical History:  Procedure Laterality Date  . ELECTROPHYSIOLOGIC STUDY N/A 04/12/2016   Procedure: SVT Ablation;  Surgeon: Marinus MawGregg W Taylor, MD;  [facility]: [facility] INVASIVE CV [facility];  Service: Cardiovascular;  Laterality: N/A;  . HEMANGIOMA EXCISION  1972 - 1990   "right breast"  . REDUCTION MAMMAPLASTY Left 1990   "to make it the same size as the right"  . TOE SURGERY Right ~ 1985   "removal of tumor in toe"    Family History  Problem Relation Age of Onset  . Diabetes Mother   . Cancer Mother     bladder  . Heart attack Father   . Heart disease  Father   . Urolithiasis Father   . Colon cancer Neg Hx   . Colon polyps Neg Hx     Social History   Social History  . Marital status: Single    Spouse name: N/A  . Number of children: 0  . Years of education: N/A   Occupational History  . Environmental health practitionerAdministrative Assistant    Social History Main Topics  . Smoking status: Never Smoker  . Smokeless tobacco: Never Used  . Alcohol use No  . Drug use: No  . Sexual activity: No   Other Topics Concern  . Not on file   Social History Narrative  . No narrative on file      Review of systems: Review of Systems  Constitutional: Negative for fever and chills.  HENT: Negative.   Eyes:  Negative for blurred vision.  Respiratory: Negative for cough, shortness of breath and wheezing.   Cardiovascular: Negative for chest pain and palpitations.  Gastrointestinal: as per HPI Genitourinary: Negative for dysuria, urgency, frequency and hematuria.  Musculoskeletal: Negative for myalgias, back pain and joint pain.  Skin: Negative for itching and rash.  Neurological: Negative for dizziness, tremors, focal weakness, seizures and loss of consciousness.  Endo/Heme/Allergies: Positive for seasonal allergies.  Psychiatric/Behavioral: Negative for depression, suicidal ideas and hallucinations.  All other systems reviewed and are negative.   Physical Exam: Vitals:   05/20/16 0940  BP: 128/84  Pulse: 78   Body mass index is 49.78 kg/m. Gen:      No acute distress HEENT:  EOMI, sclera anicteric Neck:     No masses; no thyromegaly Lungs:    Clear to auscultation bilaterally; normal respiratory effort CV:         Regular rate and rhythm; no murmurs Abd:      + bowel sounds; soft, non-tender; no palpable masses, no distension Ext:    No edema; adequate peripheral perfusion Skin:      Warm and dry; no rash Neuro: alert and oriented x 3 Psych: normal mood and affect  Data Reviewed:  Reviewed labs, radiology imaging, old records and pertinent past GI work up   Assessment and Plan/Recommendations:  50 year old female here with complaints of alternating constipation and diarrhea associated with excessive bloating and flatus Advise patient to start Benefiber 1 tablespoon 3 times a day to regulate bowel habits Start probiotic align one capsule daily We'll do a trial of lactose-free diet for 2 weeks to see if lactose intolerance playing any role with increased bloating flatus and diarrhea MiraLAX half capful daily at bedtime as needed to prevent constipation If continues to have persistent symptoms we'll consider anorectal manometry to evaluate for possible outlet  dysfunction  Greater than 50% of the time used for counseling as well as treatment plan and follow-up. She had multiple questions which were answered to her satisfaction  K. Scherry RanVeena Sereen Schaff , MD [phone] Mon-Fri 8a-5p [phone] after 5p, weekends, holidays  CC: No ref. provider found

## 2016-05-20 NOTE — Patient Instructions (Addendum)
Use Align 1 capsule daily  Take Benefiber 1 tablespoon three times a day  Use Miralax 1/2 capful daily at bedtime  Trial of lactose free diet for 2 weeks    Lactose-Free Diet, Adult Introduction If you have lactose intolerance, you are not able to digest lactose. Lactose is a natural sugar found mainly in milk and milk products. You may need to avoid all foods and beverages that contain lactose. A lactose-free diet can help you do this. What do I need to know about this diet?  Do not consume foods, beverages, vitamins, minerals, or medicines with lactose. Read ingredients lists carefully.  Look for the words "lactose-free" on labels.  Use lactase enzyme drops or tablets as directed by your health care provider.  Use lactose-free milk or a milk alternative, such as soy milk, for drinking and cooking.  Make sure you get enough calcium and vitamin D in your diet. A lactose-free eating plan can be lacking in these important nutrients.  Take calcium and vitamin D supplements as directed by your health care provider. Talk to your provider about supplements if you are not able to get enough calcium and vitamin D from food. Which foods have lactose? Lactose is found in:  Milk and foods made from milk.  Yogurt.  Cheese.  Butter.  Margarine.  Sour cream.  Cream.  Whipped toppings and nondairy creamers.  Ice cream and other milk-based desserts. Lactose is also found in foods or products made with milk or milk ingredients. To find out whether a food contains milk or a milk ingredient, look at the ingredients list. Avoid foods with the statement "May contain milk" and foods that contain:  Butter.  Cream.  Milk.  Milk solids.  Milk powder.  Whey.  Curd.  Caseinate.  Lactose.  Lactalbumin.  Lactoglobulin. What are some alternatives to milk and foods made with milk products?  Lactose-free milk.  Soy milk with added calcium and vitamin D.  Almond, coconut, or  rice milk with added calcium and vitamin D. Note that these are low in protein.  Soy products, such as soy yogurt, soy cheese, soy ice cream, and soy-based sour cream. Which foods can I eat? Grains  Breads and rolls made without milk, such as [place], [place], or [place] bread, bagels, pita, and Pitney Bowesmatzo. Corn tortillas, corn meal, grits, and polenta. Crackers without lactose or milk solids, such as soda crackers and graham crackers. Cooked or dry cereals without lactose or milk solids. Pasta, quinoa, couscous, barley, oats, bulgur, farro, rice, wild rice, or other grains prepared without milk or lactose. Plain popcorn. Vegetables  Fresh, frozen, and canned vegetables without cheese, cream, or butter sauces. Fruits  All fresh, canned, frozen, or dried fruits that are not processed with lactose. Meats and Other Protein Sources  Plain beef, chicken, fish, [place], lamb, veal, pork, wild game, or ham. Kosher-prepared meat products. Strained or junior meats that do not contain milk. Eggs. Soy meat substitutes. Beans, lentils, and hummus. Tofu. Nuts and seeds. Peanut or other nut butters without lactose. Soups, casseroles, and mixed dishes without cheese, cream, or milk. Dairy  Lactose-free milk. Soy, rice, or almond milk with added calcium and vitamin D. Soy cheese and yogurt. Beverages  Carbonated drinks. Tea. Coffee, freeze-dried coffee, and some instant coffees. Fruit and vegetable juices. Condiments  Soy sauce. Carob powder. Olives. Gravy made with water. Baker's cocoa. Rosita FirePickles. Pure seasonings and spices. Ketchup. Mustard. Bouillon. Broth. Sweets and Desserts  Water and fruit ices. Gelatin. Cookies, pies, or  cakes made from allowed ingredients, such as angel food cake. Pudding made with water or a milk substitute. Lactose-free tofu desserts. Soy, coconut milk, or rice-milk-based frozen desserts. Sugar. Honey. Jam, jelly, and marmalade. Molasses. Pure sugar candy. Dark chocolate without milk.  Marshmallows. Fats and Oils  Margarines and salad dressings that do not contain milk. Berniece Salines. Vegetable oils. Shortening. Mayonnaise. Soy or coconut-based cream. The items listed above may not be a complete list of recommended foods or beverages. Contact your dietitian for more options.  Which foods are not recommended? Grains  Breads and rolls that contain milk. Toaster pastries. Muffins, biscuits, waffles, cornbread, and pancakes. These can be prepared at home, commercial, or from mixes. Sweet rolls, donuts, English muffins, fry bread, lefse, flour tortillas with lactose, or [place] toast made with milk or milk ingredients. Crackers that contain lactose. Corn curls. Cooked or dry cereals with lactose. Vegetables  Creamed or breaded vegetables. Vegetables in a cheese or butter sauce or with lactose-containing margarines. Instant potatoes. [place] fries. Scalloped or au gratin potatoes. Fruits  None. Meats and Other Protein Sources  Scrambled eggs, omelets, and souffles that contain milk. Creamed or breaded meat, fish, chicken, or [place]. Sausage products, such as wieners and liver sausage. Cold cuts that contain milk solids. Cheese, cottage cheese, ricotta cheese, and cheese spreads. Lasagna and macaroni and cheese. Pizza. Peanut or other nut butters with added milk solids. Casseroles or mixed dishes containing milk or cheese. Dairy  All dairy products, including milk, goat's milk, buttermilk, kefir, acidophilus milk, flavored milk, evaporated milk, condensed milk, dulce de [place], eggnog, yogurt, cheese, and cheese spreads. Beverages  Hot chocolate. Cocoa with lactose. Instant iced teas. Powdered fruit drinks. Smoothies made with milk or yogurt. Condiments  Chewing gum that has lactose. Cocoa that has lactose. Spice blends if they contain milk products. Artificial sweeteners that contain lactose. Nondairy creamers. Sweets and Desserts  Ice cream, ice milk, gelato, sherbet, and frozen yogurt.  Custard, pudding, and mousse. Cake, cream pies, cookies, and other desserts containing milk, cream, cream cheese, or milk chocolate. Pie crust made with milk-containing margarine or butter. Reduced-calorie desserts made with a sugar substitute that contains lactose. Toffee and butterscotch. Milk, white, or dark chocolate that contains milk. Fudge. Caramel. Fats and Oils  Margarines and salad dressings that contain milk or cheese. Cream. Half and half. Cream cheese. Sour cream. Chip dips made with sour cream or yogurt. The items listed above may not be a complete list of foods and beverages to avoid. Contact your dietitian for more information.  Am I getting enough calcium? Calcium is found in many foods that contain lactose and is important for bone health. The amount of calcium you need depends on your age:  Adults younger than 50 years: 1000 mg of calcium a day.  Adults older than 50 years: 1200 mg of calcium a day. If you are not getting enough calcium, other calcium sources include:  Orange juice with calcium added. There are 300-350 mg of calcium in 1 cup of orange juice.  Sardines with edible bones. There are 325 mg of calcium in 3 oz of sardines.  Calcium-fortified soy milk. There are 300-400 mg of calcium in 1 cup of calcium-fortified soy milk.  Calcium-fortified rice or almond milk. There are 300 mg of calcium in 1 cup of calcium-fortified rice or almond milk.  Canned salmon with edible bones. There are 180 mg of calcium in 3 oz of canned salmon with edible bones.  Calcium-fortified breakfast cereals. There are  [phone] mg of calcium in calcium-fortified breakfast cereals.  Tofu set with calcium sulfate. There are 250 mg of calcium in  cup of tofu set with calcium sulfate.  Spinach, cooked. There are 145 mg of calcium in  cup of cooked spinach.  Edamame, cooked. There are 130 mg of calcium in  cup of cooked edamame.  Collard greens, cooked. There are 125 mg of calcium in   cup of cooked collard greens.  Kale, frozen or cooked. There are 90 mg of calcium in  cup of cooked or frozen kale.  Almonds. There are 95 mg of calcium in  cup of almonds.  Broccoli, cooked. There are 60 mg of calcium in 1 cup of cooked broccoli. This information is not intended to replace advice given to you by your health care provider. Make sure you discuss any questions you have with your health care provider. Document Released: 11/30/2001 Document Revised: 11/16/2015 Document Reviewed: 09/10/2013  2017 Elsevier

## 2016-05-27 ENCOUNTER — Telehealth: Payer: Self-pay | Admitting: Gastroenterology

## 2016-05-27 NOTE — Telephone Encounter (Signed)
Patient returned call, Went over with her Dr Sharol GivenNandigams instructions from her last office visit Patient verbally understands and will contact the office if she has any further questions

## 2016-05-27 NOTE — Telephone Encounter (Signed)
The note was signed. Thanks

## 2016-05-27 NOTE — Telephone Encounter (Signed)
Dr Lavon PaganiniNandigam patient has questions from her office visit on 11/27 can you finish this note... Thank You

## 2016-07-05 ENCOUNTER — Telehealth: Payer: Self-pay | Admitting: Internal Medicine

## 2016-07-05 NOTE — Telephone Encounter (Signed)
Patient states she had an ablation in November, 2017.  Night before last she had a rapid heart rate, not as bad as before her ablation, but she could see her heart beat in her neck.  She almost went to the ED because she did not have any medication to take.  Is this normal?

## 2016-07-05 NOTE — Telephone Encounter (Signed)
Returned call to patient.She stated she had a episode of fast heart beat 2 nights ago that lasted appox 20 min.Stated she has been under a lot of stress.Stated she wanted to ask Dr.Taylor if she needed to be seen.Stated this is first time she has had fast heart beat since ablation.Message sent to Dr.Taylor for advice.

## 2016-07-09 NOTE — Telephone Encounter (Signed)
Patient tells me this episode, last week, was "not as forceful as before ablation".  Vagal maneuvers did not work.  Informed patient that this may be related to the stress she reported. Explained that she should continue to monitor and call office back if she has more palpitations and/or sustained episodes. If this occurs Dr. Ladona Ridgelaylor recommends 30 day monitor to assess and pt is agreeable if this is needed in the future.  Patient verbalized understanding and agreeable to plan.

## 2016-07-09 NOTE — Telephone Encounter (Signed)
07-09-16 Pt calling to fu on call from Friday, just needs to know if she needs to come in or not? pls call

## 2017-12-15 ENCOUNTER — Emergency Department (HOSPITAL_BASED_OUTPATIENT_CLINIC_OR_DEPARTMENT_OTHER)
Admission: EM | Admit: 2017-12-15 | Discharge: 2017-12-15 | Disposition: A | Discharge status: Home / Self Care | Payer: Self-pay | Attending: Emergency Medicine | Admitting: Emergency Medicine

## 2017-12-15 ENCOUNTER — Emergency Department (HOSPITAL_BASED_OUTPATIENT_CLINIC_OR_DEPARTMENT_OTHER): Payer: Self-pay

## 2017-12-15 ENCOUNTER — Encounter (HOSPITAL_BASED_OUTPATIENT_CLINIC_OR_DEPARTMENT_OTHER): Payer: Self-pay | Admitting: *Deleted

## 2017-12-15 ENCOUNTER — Other Ambulatory Visit: Payer: Self-pay

## 2017-12-15 DIAGNOSIS — I10 Essential (primary) hypertension: Secondary | ICD-10-CM | POA: Insufficient documentation

## 2017-12-15 DIAGNOSIS — M25562 Pain in left knee: Secondary | ICD-10-CM | POA: Insufficient documentation

## 2017-12-15 MED ORDER — MELOXICAM 7.5 MG PO TABS: 7.5000 mg | ORAL_TABLET | Freq: Every day | ORAL | 0 refills | Status: AC

## 2017-12-15 NOTE — ED Triage Notes (Signed)
Pt reports left knee pain x 2 months after gardening. Over the past few weeks the pain has gone into her lower leg.

## 2017-12-15 NOTE — Discharge Instructions (Addendum)
Take Tramadol as directed.   You can take Tylenol or Ibuprofen as directed for pain. You can alternate Tylenol and Ibuprofen every 4 hours. If you take Tylenol at 1pm, then you can take Ibuprofen at 5pm. Then you can take Tylenol again at 9pm.   If tylenol or ibuprofen doesn't work, you can try Mobic.   Follow-up with referred orthopedic doctor for further evaluation.   Return to the Emergency Department for redness/swelling of the leg, fever, numbness/weakness, chest pain, difficulty breathing or any other worsening or concerning symptoms.

## 2017-12-15 NOTE — ED Provider Notes (Signed)
[facility] EMERGENCY DEPARTMENT Provider Note   CSN: [id number] Arrival date & time: 12/15/17  1737     History   Chief Complaint Chief Complaint  Patient presents with  . Knee Pain    HPI Jennifer Howard is a 52 y.o. female past medical history of depression, GERD, hypertension, migraines who presents for evaluation of left knee pain that is been ongoing for the last month.  Patient reports that knee pain initially started when she was gardening.  She states that she was squatting and felt like she twinge something in her knee.  Patient reports that since then, she had been experiencing pain to the knee.  She described that she also heard a "cracking sound" whenever she would walk or move the knee.  Patient reports that she was able to ambulate and bear weight but states that it was more painful when she did so.  Patient reports that over the last 2 weeks, the pain has started to go to the posterior aspect of her leg and has radiated down distally.  She states that she has noticed some swelling but has not noticed any overlying warmth, erythema.  She states that yesterday, she had to start using a cane to walk.  Patient denies any other new trauma, injury, fall.  Patient denies any fevers, numbness/weakness.  The history is provided by the patient.    Past Medical History:  Diagnosis Date  . Arthritis    "little; in my back, right foot" (04/12/2016)  . Chronic lower back pain   . Depression   . GERD (gastroesophageal reflux disease)   . HTN (hypertension)   . Migraine    "twice/week" (04/12/2016)  . Obesity   . SVT (supraventricular tachycardia) (HCC)    a. s/p RFCA on 04/12/16    Patient Active Problem List   Diagnosis Date Noted  . SVT (supraventricular tachycardia) (HCC) 04/12/2016  . HTN (hypertension)   . Morbid obesity (HCC) 10/14/2014    Past Surgical History:  Procedure Laterality Date  . ELECTROPHYSIOLOGIC STUDY N/A 04/12/2016   Procedure: SVT Ablation;   Surgeon: Marinus Maw, MD;  Location: [facility] INVASIVE CV LAB;  Service: Cardiovascular;  Laterality: N/A;  . HEMANGIOMA EXCISION  1972 - 1990   "right breast"  . REDUCTION MAMMAPLASTY Left 1990   "to make it the same size as the right"  . TOE SURGERY Right ~ 1985   "removal of tumor in toe"     OB History   None      Home Medications    Prior to Admission medications   Medication Sig Start Date End Date Taking? Authorizing Provider  acetaminophen (TYLENOL) 500 MG tablet Take 1,000 mg by mouth every 6 (six) hours as needed for mild pain.     [provider]  chlorpheniramine (CHLOR-TRIMETON) 4 MG tablet Take 4 mg by mouth daily as needed for allergies.    [provider]  DULoxetine (CYMBALTA) 60 MG capsule Take 120 mg by mouth every evening.     [provider]  fluticasone (FLONASE) 50 MCG/ACT nasal spray Place 2 sprays into the nose daily as needed for allergies.     [provider]  ibuprofen (ADVIL,MOTRIN) 200 MG tablet Take 600 mg by mouth every 6 (six) hours as needed for moderate pain. Patient used this medication for pain.     [provider]  lamoTRIgine (LAMICTAL) 200 MG tablet Take 400 mg by mouth daily.    [provider]  lidocaine (LIDODERM) 5 % Place 1 patch onto the skin daily. Remove & Discard patch within 12 hours or as directed by MD    [provider]  meloxicam (MOBIC) 7.5 MG tablet Take 1 tablet (7.5 mg total) by mouth daily for 7 days. 12/15/17 12/22/17  Maxwell CaulLayden, Cherina Dhillon A, PA-C  topiramate (TOPAMAX) 25 MG capsule Take 50 mg by mouth at bedtime.    [provider]  trolamine salicylate (ASPERCREME) 10 % cream Apply 1 application topically as needed for muscle pain. Patient used this medication for foot and shoulder pain.     [provider]  zolmitriptan (ZOMIG) 5 MG tablet Take 2.5-5 mg by mouth as needed for migraine.     [provider]    Family History Family History    Problem Relation Age of Onset  . Diabetes Mother   . Cancer Mother        bladder  . Heart attack Father   . Heart disease Father   . Urolithiasis Father   . Colon cancer Neg Hx   . Colon polyps Neg Hx     Social History Social History   Tobacco Use  . Smoking status: Never Smoker  . Smokeless tobacco: Never Used  Substance Use Topics  . Alcohol use: No  . Drug use: No     Allergies   Prednisone; Iodine; and Penicillins   Review of Systems Review of Systems  Constitutional: Negative for fever.  Musculoskeletal: Positive for joint swelling.       Knee pain  Skin: Negative for color change.  Neurological: Negative for weakness and numbness.     Physical Exam Updated Vital Signs BP (!) 143/83 (BP Location: Left Arm)   Pulse 78   Temp 98.3 F (36.8 C) (Oral)   Resp 18   Ht 5\' 5"  (1.651 m)   Wt 131.5 kg (290 lb)   LMP  (LMP Unknown)   SpO2 99%   BMI 48.26 kg/m   Physical Exam  Constitutional: She appears well-developed and well-nourished.  HENT:  Head: Normocephalic and atraumatic.  Eyes: Conjunctivae and EOM are normal. Right eye exhibits no discharge. Left eye exhibits no discharge. No scleral icterus.  Cardiovascular:  Pulses:      Dorsalis pedis pulses are 2+ on the right side, and 2+ on the left side.  Pulmonary/Chest: Effort normal.  Musculoskeletal:  Tenderness palpation noted to the medial aspect of the left knee that extends posteriorly.  There is some mild overlying soft tissue swelling.  No overlying warmth, erythema.  No calf tenderness to palpation.  No calf warmth, erythema, edema.  Flexion/extension intact.  Negative anterior posterior drawer test.  No instability noted on varus or valgus stress.  No abnormalities of the right lower extremity.  No pain to the distal tib-fib, proximal tib-fib.  Neurological: She is alert.  Sensation intact along major nerve distributions of BLE  Skin: Skin is warm and dry. Capillary refill takes less than 2  seconds.  Good distal cap refill.  LLE is not dusky in appearance or cool to touch.  Psychiatric: She has a normal mood and affect. Her speech is normal and behavior is normal.  Nursing note and vitals reviewed.    ED Treatments / Results  Labs (all labs ordered are listed, but only abnormal results are displayed) Labs Reviewed - No data to display  EKG None  Radiology Dg Knee Complete 4 Views Left  Result Date: 12/15/2017 CLINICAL DATA:  Left lateral knee pain  for a few months. EXAM: LEFT KNEE - COMPLETE 4+ VIEW COMPARISON:  None. FINDINGS: No evidence of fracture, dislocation, or joint effusion. Slight femorotibial joint space narrowing. Small bone island of the lateral femoral metaphysis. No aggressive osseous lesions. Soft tissues are unremarkable. IMPRESSION: No acute osseous abnormality of the left knee. Mild degenerative joint space narrowing of the femorotibial compartment. Electronically Signed   By: Tollie Eth M.D.   On: 12/15/2017 18:42   [place] Lt Lower Extrem Ltd Soft Tissue Non Vascular  Result Date: 12/15/2017 CLINICAL DATA:  Left knee pain for 1 month with increased pain in the popliteal fossa x1 week. EXAM: ULTRASOUND LEFT LOWER EXTREMITY LIMITED TECHNIQUE: Ultrasound examination of the lower extremity soft tissues was performed in the area of clinical concern. COMPARISON:  None. FINDINGS: Joint Space: There is a small 1.9 x 1.9 x 4 cm crescentic shaped fluid collection noted just lateral to the lateral margin of the patella. This more likely represents imaging of a suprapatellar joint effusion. Differential considerations may include stigmata of small hematoma. Muscles: Normal. Tendons: Normal Other Soft Tissue Structures: No deep venous thrombosis of the popliteal vein. No popliteal cyst identified. IMPRESSION: Small fluid collection just lateral to the lateral margin of the patella more commonly associated with a suprapatellar joint effusion. Differential possibilities might  include stigmata of a hematoma, favor small joint effusion. No popliteal cyst noted. Electronically Signed   By: Tollie Eth M.D.   On: 12/15/2017 20:16    Procedures Procedures (including critical care time)  Medications Ordered in ED Medications - No data to display   Initial Impression / Assessment and Plan / ED Course  I have reviewed the triage vital signs and the nursing notes.  Pertinent labs & imaging results that were available during my care of the patient were reviewed by me and considered in my medical decision making (see chart for details).     52 year old female who presents for evaluation of left knee pain that is been ongoing for last month.  Reports started after gardening.  No new trauma, injury, fall.  Reports for last 2 weeks, pain is intensified and is started going to the back of her leg and extending distally.  No overlying warmth, erythema.  No fevers, numbness/weakness. Patient is afebrile, non-toxic appearing, sitting comfortably on examination table. Vital signs reviewed and stable. Patient is neurovascularly intact.  On exam, patient with tenderness palpation of the medial aspect of the knee that extends posteriorly.  No overlying warmth, erythema, edema.  Consider sprain.  Low suspicion for fracture dislocation given history/physical exam.  Also consider popliteal cyst. Do not suspect DVT based on history/physical exam.  History/physical exam is not concerning for acute arterial embolism, septic arthritis.  Plan for x-ray and ultrasound evaluation for evaluation of popliteal cyst.   X-ray reviewed.  There is no bony abnormality.  There is some mild degenerative joint space narrowing noted but otherwise no abnormalities.  Ultrasound of lower extremity reveals small suprapatellar joint effusion. No popliteal cyst noted.   Discussed with ED attending after independent evaluation. Do not suspect DVT or septic arthritis at this time.   Discussed results with patient.   Encouraged at home supportive care therapy.  We will plan to give outpatient referral for orthopedics for patient to follow-up with.  I discussed finding of possible suprapatellar effusion on ultrasound.  Given that the leg is not warm, erythematous does not appear to be concerning for septic arthritis, no indication for arthrocentesis at this time.  Encourage patient to follow-up with orthopedics as directed. Patient had ample opportunity for questions and discussion. All patient's questions were answered with full understanding. Strict return precautions discussed. Patient expresses understanding and agreement to plan.    Final Clinical Impressions(s) / ED Diagnoses   Final diagnoses:  Acute pain of left knee    ED Discharge Orders        Ordered    meloxicam (MOBIC) 7.5 MG tablet  Daily     12/15/17 2033       Maxwell Caul, PA-C 12/15/17 2043    Arby Barrette, MD 12/25/17 2237

## 2017-12-15 NOTE — ED Notes (Signed)
Pt requesting tramadol, PA states unable  to prescrib d/t pt taking cymbalta

## 2017-12-15 NOTE — ED Provider Notes (Signed)
Medical screening examination/treatment/procedure(s) were conducted as a shared visit with non-physician practitioner(s) and myself.  I personally evaluated the patient during the encounter.  None Patient describes recurrent left knee pain over a month after gardening.  This is usually exacerbated or reproduced by weightbearing, climbing steps and knee bends.  She reports that due to insurance problems she is unable to see orthopedics or have routine care for it.  She has been trying some compression and elevating.  Patient is alert nontoxic.  Well in appearance.  Knee has no erythema.  Possible very small suprapatellar effusion.  Most pain is at the medial joint line.  Agree with plan of management.   Arby BarrettePfeiffer, Vernee Baines, MD 12/17/17 [phone]

## 2018-01-17 ENCOUNTER — Emergency Department (HOSPITAL_COMMUNITY)
Admission: EM | Admit: 2018-01-17 | Discharge: 2018-01-17 | Disposition: A | Discharge status: Home / Self Care | Payer: Self-pay | Attending: Emergency Medicine | Admitting: Emergency Medicine

## 2018-01-17 ENCOUNTER — Encounter (HOSPITAL_COMMUNITY): Payer: Self-pay | Admitting: *Deleted

## 2018-01-17 DIAGNOSIS — Y9389 Activity, other specified: Secondary | ICD-10-CM | POA: Insufficient documentation

## 2018-01-17 DIAGNOSIS — R2242 Localized swelling, mass and lump, left lower limb: Secondary | ICD-10-CM | POA: Insufficient documentation

## 2018-01-17 DIAGNOSIS — Y9289 Other specified places as the place of occurrence of the external cause: Secondary | ICD-10-CM | POA: Insufficient documentation

## 2018-01-17 DIAGNOSIS — Y999 Unspecified external cause status: Secondary | ICD-10-CM | POA: Insufficient documentation

## 2018-01-17 DIAGNOSIS — X509XXA Other and unspecified overexertion or strenuous movements or postures, initial encounter: Secondary | ICD-10-CM | POA: Insufficient documentation

## 2018-01-17 DIAGNOSIS — M25562 Pain in left knee: Secondary | ICD-10-CM | POA: Insufficient documentation

## 2018-01-17 DIAGNOSIS — Z79899 Other long term (current) drug therapy: Secondary | ICD-10-CM | POA: Insufficient documentation

## 2018-01-17 DIAGNOSIS — I10 Essential (primary) hypertension: Secondary | ICD-10-CM | POA: Insufficient documentation

## 2018-01-17 MED ORDER — HYDROCODONE-ACETAMINOPHEN 5-325 MG PO TABS
1.0000 | ORAL_TABLET | Freq: Once | ORAL | Status: DC
  Filled 2018-01-17: qty 1

## 2018-01-17 MED ORDER — LIDOCAINE HCL 10 % EX CREA: 1.0000 [in_us] | TOPICAL_CREAM | Freq: Three times a day (TID) | CUTANEOUS | 0 refills | Status: DC

## 2018-01-17 NOTE — ED Triage Notes (Signed)
Pt complains of left knee pain that became worse yesterday. Pt states she was at work and felt a pop when she turned to go down a hallway. Pt has had left knee pain for the past few months, has had ultrasound and x-ray performed last month for knee pain.

## 2018-01-17 NOTE — Discharge Instructions (Addendum)
Use the lidocaine cream as directed. [facility] to schedule an [facility].

## 2018-01-17 NOTE — ED Provider Notes (Signed)
[facility]-EMERGENCY DEPT Provider Note   CSN: [id number] Arrival date & time: 01/17/18  1349     History   Chief Complaint Chief Complaint  Patient presents with  . Knee Pain    HPI Jennifer Howard is a 52 y.o. female who presents to the ED for left knee pain.  Past medical history includes depression, GERD, hypertension, migraines.the knee pain has been ongoing for the last 2 months.  Patient reports that knee pain initially started when she was gardening.  She states that she was squatting and felt like she twinge something in her knee.  Patient reports that since then, she had been experiencing pain to the knee.  She reports that she was at work yesterday and got up and felt a pop and also heard a "cracking sound" whenever she would walk or move the knee.  Patient reports that she was able to ambulate and bear weight but states that it was more painful when she did so.  Patient was evaluated and had x-rays of the knee and ultrasound of the left lower extremity 12/15/17 that were normal except for a tiny effusion. Patient was started on Mobic for pain at last visit. Patient continues to have pain. Patient did not f/u with ortho as was suggested at her last visit because she did not want to miss work and does not have insurance. Patient requesting MRI tonight. HPI  Past Medical History:  Diagnosis Date  . Arthritis    "little; in my back, right foot" (04/12/2016)  . Chronic lower back pain   . Depression   . GERD (gastroesophageal reflux disease)   . HTN (hypertension)   . Migraine    "twice/week" (04/12/2016)  . Obesity   . SVT (supraventricular tachycardia) (HCC)    a. s/p RFCA on 04/12/16    Patient Active Problem List   Diagnosis Date Noted  . SVT (supraventricular tachycardia) (HCC) 04/12/2016  . HTN (hypertension)   . Morbid obesity (HCC) 10/14/2014    Past Surgical History:  Procedure Laterality Date  . ELECTROPHYSIOLOGIC STUDY N/A 04/12/2016   Procedure: SVT Ablation;  Surgeon: Marinus Maw, MD;  Location: [facility] INVASIVE CV LAB;  Service: Cardiovascular;  Laterality: N/A;  . HEMANGIOMA EXCISION  1972 - 1990   "right breast"  . REDUCTION MAMMAPLASTY Left 1990   "to make it the same size as the right"  . TOE SURGERY Right ~ 1985   "removal of tumor in toe"     OB History   None      Home Medications    Prior to Admission medications   Medication Sig Start Date End Date Taking? Authorizing Provider  acetaminophen (TYLENOL) 500 MG tablet Take 1,000 mg by mouth every 6 (six) hours as needed for mild pain.     [provider]  chlorpheniramine (CHLOR-TRIMETON) 4 MG tablet Take 4 mg by mouth daily as needed for allergies.    [provider]  DULoxetine (CYMBALTA) 60 MG capsule Take 120 mg by mouth every evening.     [provider]  fluticasone (FLONASE) 50 MCG/ACT nasal spray Place 2 sprays into the nose daily as needed for allergies.     [provider]  ibuprofen (ADVIL,MOTRIN) 200 MG tablet Take 600 mg by mouth every 6 (six) hours as needed for moderate pain. Patient used this medication for pain.     [provider]  lamoTRIgine (LAMICTAL) 200 MG tablet Take 400 mg by mouth daily.  [provider]  lidocaine (LIDODERM) 5 % Place 1 patch onto the skin daily. Remove & Discard patch within 12 hours or as directed by MD    [provider]  Lidocaine HCl 10 % CREA Apply 1 inch topically 3 (three) times daily. Use a small amount of the cream on the affected area 3 times a day. 01/17/18   Janne Napoleon, NP  topiramate (TOPAMAX) 25 MG capsule Take 50 mg by mouth at bedtime.    [provider]  trolamine salicylate (ASPERCREME) 10 % cream Apply 1 application topically as needed for muscle pain. Patient used this medication for foot and shoulder pain.     [provider]  zolmitriptan (ZOMIG) 5 MG tablet Take 2.5-5 mg by mouth as needed for migraine.      [provider]    Family History Family History  Problem Relation Age of Onset  . Diabetes Mother   . Cancer Mother        bladder  . Heart attack Father   . Heart disease Father   . Urolithiasis Father   . Colon cancer Neg Hx   . Colon polyps Neg Hx     Social History Social History   Tobacco Use  . Smoking status: Never Smoker  . Smokeless tobacco: Never Used  Substance Use Topics  . Alcohol use: No  . Drug use: No     Allergies   Prednisone; Iodine; and Penicillins   Review of Systems Review of Systems  Musculoskeletal: Positive for arthralgias.       Knee pain  All other systems reviewed and are negative.    Physical Exam Updated Vital Signs BP (!) 158/98 (BP Location: Left Arm)   Pulse 99   Temp 98.2 F (36.8 C) (Oral)   Resp 18   SpO2 98%   Physical Exam  Constitutional: No distress.  Morbidly obese  HENT:  Head: Normocephalic.  Eyes: EOM are normal.  Neck: Neck supple.  Cardiovascular: Normal rate.  Pulmonary/Chest: Effort normal.  Musculoskeletal:       Left knee: She exhibits decreased range of motion (due to pain) and swelling. She exhibits no ecchymosis, no deformity and no erythema. Tenderness found. MCL tenderness noted.  Pedal pulse 2+, adequate circulation.  Neurological: She is alert.  Skin: Skin is warm and dry.  Psychiatric: She has a normal mood and affect.  Nursing note and vitals reviewed.    ED Treatments / Results  Labs (all labs ordered are listed, but only abnormal results are displayed) Labs Reviewed - No data to display  Radiology No results found.  Procedures Procedures (including critical care time)  Medications Ordered in ED Medications  HYDROcodone-acetaminophen (NORCO/VICODIN) 5-325 MG per tablet 1 tablet (1 tablet Oral Refused 01/17/18 1648)   I discussed this case with Dr. Ranae Palms and the patient's request for MRI.  I discussed with the patient that she will need f/u with ortho to  determine what the next treatment will be and that we will not do MRI tonight. Contacted Case Manager and patient is to call [facility] for follow up. Patient appears stable for d/c.   Initial Impression / Assessment and Plan / ED Course  I have reviewed the triage vital signs and the nursing notes.  Final Clinical Impressions(s) / ED Diagnoses   Final diagnoses:  Left knee pain, unspecified chronicity    ED Discharge Orders        Ordered    Lidocaine  HCl 10 % CREA  3 times daily     01/17/18 1736       Damian Leavelleese, [place] M, [place]NP 01/17/18 1757    Sabas SousBero, Michael M, MD 01/17/18 [id number] [id number]

## 2018-01-19 ENCOUNTER — Telehealth: Payer: Self-pay

## 2018-01-19 NOTE — Telephone Encounter (Signed)
Message received from Eldridge AbrahamsAngela Kritzer, RN CM requesting a hospital follow [facility].  Attempted to contact the patient as there is an appointment available 01/30/09 @ 1410 and the appointment may Janae Bridgeman/may not be available when/if she calls back. Call placed to # [phone] and the voicemail was full. Unable to leave a message. Call placed to # [phone] and her mother answered stating that she is at work. She will give her the message to call this CM back @ # [phone].    Update provided to Breck CoonsA. Kritzer, RN CM

## 2018-01-19 NOTE — Care Management Note (Signed)
Case Management Note  CM contacted for no pcp and no ins with need for follow up.  CM noted [facility].  She is able to schedule an appointment herself.  CM will send a message to [facility] for possible appointment cancellation openings.  Updated Neese via messages.  No further CM needs noted at this time.  Jennifer Howard, Lynnae SandhoffAngela N, RN 01/19/2018, 9:18 AM

## 2018-01-23 ENCOUNTER — Encounter (HOSPITAL_BASED_OUTPATIENT_CLINIC_OR_DEPARTMENT_OTHER): Payer: Self-pay | Admitting: Emergency Medicine

## 2018-01-23 ENCOUNTER — Other Ambulatory Visit: Payer: Self-pay

## 2018-01-23 ENCOUNTER — Emergency Department (HOSPITAL_BASED_OUTPATIENT_CLINIC_OR_DEPARTMENT_OTHER)
Admission: EM | Admit: 2018-01-23 | Discharge: 2018-01-23 | Disposition: A | Discharge status: Home / Self Care | Payer: Self-pay | Attending: Emergency Medicine | Admitting: Emergency Medicine

## 2018-01-23 DIAGNOSIS — I471 Supraventricular tachycardia: Secondary | ICD-10-CM | POA: Insufficient documentation

## 2018-01-23 DIAGNOSIS — I10 Essential (primary) hypertension: Secondary | ICD-10-CM | POA: Insufficient documentation

## 2018-01-23 DIAGNOSIS — Z79899 Other long term (current) drug therapy: Secondary | ICD-10-CM | POA: Insufficient documentation

## 2018-01-23 MED ORDER — ADENOSINE 6 MG/2ML IV SOLN
INTRAVENOUS | Status: AC
  Administered 2018-01-23: 6 mg via INTRAVENOUS
  Filled 2018-01-23: qty 4

## 2018-01-23 MED ORDER — SODIUM CHLORIDE 0.9 % IV BOLUS
1000.0000 mL | Freq: Once | INTRAVENOUS | Status: AC
  Administered 2018-01-23: 1000 mL via INTRAVENOUS

## 2018-01-23 MED ORDER — ADENOSINE 6 MG/2ML IV SOLN
6.0000 mg | Freq: Once | INTRAVENOUS | Status: AC
  Administered 2018-01-23: 6 mg via INTRAVENOUS

## 2018-01-23 NOTE — ED Provider Notes (Signed)
[facility] EMERGENCY DEPARTMENT Provider Note   CSN: [id number] Arrival date & time: 01/23/18  8295     History   Chief Complaint Chief Complaint  Patient presents with  . Tachycardia    HPI Jennifer Howard is a 52 y.o. female.  Patient is a 52 year old female with a history of obesity, hypertension and PSVT who presents with palpitations.  She states that she started feeling her heart racing about 545 this morning.  Its been constant since that time.  She has chest tightness associated with the palpitations.  She denies any other symptoms.  She has had similar symptoms in the past with SVT.  She states that she has tried multiple vagal maneuvers at home without improvement in symptoms.  She was previously having frequent episodes of SVT but had an ablation in 2017 and has not had any sustained episodes since that time.  She states she has had very short-lived minor episodes but this is the first time it lasted for any length of time.  She does feel like it is likely brought on by stress.  She recently started a new job and had to put her cat to sleep yesterday.  She states she was crying most of the day and feels like that brought on the PSVT.     Past Medical History:  Diagnosis Date  . Arthritis    "little; in my back, right foot" (04/12/2016)  . Chronic lower back pain   . Depression   . GERD (gastroesophageal reflux disease)   . HTN (hypertension)   . Migraine    "twice/week" (04/12/2016)  . Obesity   . SVT (supraventricular tachycardia) (HCC)    a. s/p RFCA on 04/12/16    Patient Active Problem List   Diagnosis Date Noted  . SVT (supraventricular tachycardia) (HCC) 04/12/2016  . HTN (hypertension)   . Morbid obesity (HCC) 10/14/2014    Past Surgical History:  Procedure Laterality Date  . ELECTROPHYSIOLOGIC STUDY N/A 04/12/2016   Procedure: SVT Ablation;  Surgeon: Marinus Maw, MD;  Location: [facility] INVASIVE CV LAB;  Service: Cardiovascular;  Laterality: N/A;    . HEMANGIOMA EXCISION  1972 - 1990   "right breast"  . REDUCTION MAMMAPLASTY Left 1990   "to make it the same size as the right"  . TOE SURGERY Right ~ 1985   "removal of tumor in toe"     OB History   None      Home Medications    Prior to Admission medications   Medication Sig Start Date End Date Taking? Authorizing Provider  chlorpheniramine (CHLOR-TRIMETON) 4 MG tablet Take 4 mg by mouth daily as needed for allergies.    [provider]  DULoxetine (CYMBALTA) 60 MG capsule Take 120 mg by mouth every evening.     [provider]  fluticasone (FLONASE) 50 MCG/ACT nasal spray Place 2 sprays into the nose daily as needed for allergies.     [provider]  ibuprofen (ADVIL,MOTRIN) 200 MG tablet Take 600 mg by mouth every 6 (six) hours as needed for moderate pain. Patient used this medication for pain.     [provider]  lamoTRIgine (LAMICTAL) 200 MG tablet Take 400 mg by mouth daily.    [provider]  lidocaine (LIDODERM) 5 % Place 1 patch onto the skin daily. Remove & Discard patch within 12 hours or as directed by MD    [provider]  topiramate (TOPAMAX) 25 MG capsule Take 50 mg by  mouth at bedtime.    [provider]  trolamine salicylate (ASPERCREME) 10 % cream Apply 1 application topically as needed for muscle pain. Patient used this medication for foot and shoulder pain.     [provider]  zolmitriptan (ZOMIG) 5 MG tablet Take 2.5-5 mg by mouth as needed for migraine.     [provider]    Family History Family History  Problem Relation Age of Onset  . Diabetes Mother   . Cancer Mother        bladder  . Heart attack Father   . Heart disease Father   . Urolithiasis Father   . Colon cancer Neg Hx   . Colon polyps Neg Hx     Social History Social History   Tobacco Use  . Smoking status: Never Smoker  . Smokeless tobacco: Never Used  Substance Use Topics  . Alcohol use: No   . Drug use: No     Allergies   Prednisone; Iodine; and Penicillins   Review of Systems Review of Systems  Constitutional: Negative for chills, diaphoresis, fatigue and fever.  HENT: Negative for congestion, rhinorrhea and sneezing.   Eyes: Negative.   Respiratory: Negative for cough, chest tightness and shortness of breath.   Cardiovascular: Positive for chest pain and palpitations. Negative for leg swelling.  Gastrointestinal: Negative for abdominal pain, blood in stool, diarrhea, nausea and vomiting.  Genitourinary: Negative for difficulty urinating, flank pain, frequency and hematuria.  Musculoskeletal: Negative for arthralgias and back pain.  Skin: Negative for rash.  Neurological: Negative for dizziness, speech difficulty, weakness, numbness and headaches.     Physical Exam Updated Vital Signs BP 125/79   Pulse (!) 103   Resp 20   Ht 5\' 4"  (1.626 m)   Wt 131.5 kg (290 lb)   SpO2 100%   BMI 49.78 kg/m   Physical Exam  Constitutional: She is oriented to person, place, and time. She appears well-developed and well-nourished.  HENT:  Head: Normocephalic and atraumatic.  Eyes: Pupils are equal, round, and reactive to light.  Neck: Normal range of motion. Neck supple.  Cardiovascular: Regular rhythm and normal heart sounds. Tachycardia present.  Pulmonary/Chest: Effort normal and breath sounds normal. No respiratory distress. She has no wheezes. She has no rales. She exhibits no tenderness.  Abdominal: Soft. Bowel sounds are normal. There is no tenderness. There is no rebound and no guarding.  Musculoskeletal: Normal range of motion. She exhibits edema.  Lymphadenopathy:    She has no cervical adenopathy.  Neurological: She is alert and oriented to person, place, and time.  Skin: Skin is warm and dry. No rash noted.  Psychiatric: She has a normal mood and affect.     ED Treatments / Results  Labs (all labs ordered are listed, but only abnormal results are  displayed) Labs Reviewed - No data to display  EKG EKG Interpretation  Date/Time:  Friday January 23 2018 08:54:40 EDT Ventricular Rate:  106 PR Interval:    QRS Duration: 90 QT Interval:  333 QTC Calculation: 443 R Axis:   78 Text Interpretation:  Sinus tachycardia Confirmed by Rolan BuccoBelfi, Aela Bohan [phone]) on 01/23/2018 9:04:15 AM   Radiology No results found.  Procedures Procedures (including critical care time)  Medications Ordered in ED Medications  sodium chloride 0.9 % bolus 1,000 mL (1,000 mLs Intravenous New Bag/Given 01/23/18 0839)  adenosine (ADENOCARD) 6 MG/2ML injection 6 mg (6 mg Intravenous Given 01/23/18 0840)     Initial Impression / Assessment and  Plan / ED Course  I have reviewed the triage vital signs and the nursing notes.  Pertinent labs & imaging results that were available during my care of the patient were reviewed by me and considered in my medical decision making (see chart for details).     Patient is a 52 year old female who presents with SVT.  She had tried vagal maneuvers at home and states they were unsuccessful.  She was given adenosine 6 mg for chemical cardioversion.  This converted her to a sinus rhythm.  She remained in a sinus rhythm and was asymptomatic.  She was encouraged to follow-up with Dr. Ladona Ridgel.  No ischemic changes are noted on EKG.  Return precautions were given.  CRITICAL CARE Performed by: Rolan Bucco Total critical care time: 30 minutes Critical care time was exclusive of separately billable procedures and treating other patients. Critical care was necessary to treat or prevent imminent or life-threatening deterioration. Critical care was time spent personally by me on the following activities: development of treatment plan with patient and/or surrogate as well as nursing, discussions with consultants, evaluation of patient's response to treatment, examination of patient, obtaining history from patient or surrogate, ordering and  performing treatments and interventions, ordering and review of laboratory studies, ordering and review of radiographic studies, pulse oximetry and re-evaluation of patient's condition.   Final Clinical Impressions(s) / ED Diagnoses   Final diagnoses:  SVT (supraventricular tachycardia) [facility])    ED Discharge Orders    None       Rolan Bucco, MD 01/23/18 [phone]

## 2018-01-23 NOTE — ED Triage Notes (Signed)
Tachycardia since 5 am.  Some chest tightness, some arm weakness, severe diaphoresis.

## 2018-09-25 ENCOUNTER — Emergency Department (HOSPITAL_BASED_OUTPATIENT_CLINIC_OR_DEPARTMENT_OTHER)
Admission: EM | Admit: 2018-09-25 | Discharge: 2018-09-25 | Disposition: A | Discharge status: Home / Self Care | Payer: Self-pay | Attending: Emergency Medicine | Admitting: Emergency Medicine

## 2018-09-25 ENCOUNTER — Other Ambulatory Visit: Payer: Self-pay

## 2018-09-25 DIAGNOSIS — I471 Supraventricular tachycardia: Secondary | ICD-10-CM | POA: Insufficient documentation

## 2018-09-25 DIAGNOSIS — Z79899 Other long term (current) drug therapy: Secondary | ICD-10-CM | POA: Insufficient documentation

## 2018-09-25 DIAGNOSIS — I10 Essential (primary) hypertension: Secondary | ICD-10-CM | POA: Insufficient documentation

## 2018-09-25 MED ORDER — ADENOSINE 6 MG/2ML IV SOLN
INTRAVENOUS | Status: AC
  Administered 2018-09-25: 15:00:00 6 mg
  Filled 2018-09-25: qty 6

## 2018-09-25 NOTE — ED Notes (Signed)
ED Provider at bedside. 

## 2018-09-25 NOTE — ED Notes (Signed)
Pt reading on stretcher. In NAD at this time

## 2018-09-25 NOTE — ED Provider Notes (Signed)
[facility] EMERGENCY DEPARTMENT Provider Note   CSN: [id number] Arrival date & time: 09/25/18  1502    History   Chief Complaint Chief Complaint  Patient presents with  . Irregular Heart Beat    HPI Jennifer Howard is a 53 y.o. female.     Patient is a 53 year old female who presents with palpitations.  She has a history of SVT.  She states she has previously had an ablation but has had a few episodes of SVT since the ablation.  She states normally it will go away on its own.  Today it lasts about 45 minutes and its not resolving.  She has an associated headache and feels flushed.  She denies any chest pain or shortness of breath.  She is tried vagal maneuvers at home without improvement in symptoms.  She states is been constant since it started.     Past Medical History:  Diagnosis Date  . Arthritis    "little; in my back, right foot" (04/12/2016)  . Chronic lower back pain   . Depression   . GERD (gastroesophageal reflux disease)   . HTN (hypertension)   . Migraine    "twice/week" (04/12/2016)  . Obesity   . SVT (supraventricular tachycardia) (HCC)    a. s/p RFCA on 04/12/16    Patient Active Problem List   Diagnosis Date Noted  . SVT (supraventricular tachycardia) (HCC) 04/12/2016  . HTN (hypertension)   . Morbid obesity (HCC) 10/14/2014    Past Surgical History:  Procedure Laterality Date  . ELECTROPHYSIOLOGIC STUDY N/A 04/12/2016   Procedure: SVT Ablation;  Surgeon: Marinus Maw, MD;  Location: [facility] INVASIVE CV LAB;  Service: Cardiovascular;  Laterality: N/A;  . HEMANGIOMA EXCISION  1972 - 1990   "right breast"  . REDUCTION MAMMAPLASTY Left 1990   "to make it the same size as the right"  . TOE SURGERY Right ~ 1985   "removal of tumor in toe"     OB History   No obstetric history on file.      Home Medications    Prior to Admission medications   Medication Sig Start Date End Date Taking? Authorizing Provider  citalopram (CELEXA) 10 MG  tablet Take 10 mg by mouth daily.   Yes [provider]  chlorpheniramine (CHLOR-TRIMETON) 4 MG tablet Take 4 mg by mouth daily as needed for allergies.    [provider]  DULoxetine (CYMBALTA) 60 MG capsule Take 120 mg by mouth every evening.     [provider]  fluticasone (FLONASE) 50 MCG/ACT nasal spray Place 2 sprays into the nose daily as needed for allergies.     [provider]  ibuprofen (ADVIL,MOTRIN) 200 MG tablet Take 600 mg by mouth every 6 (six) hours as needed for moderate pain. Patient used this medication for pain.     [provider]  lamoTRIgine (LAMICTAL) 200 MG tablet Take 400 mg by mouth daily.    [provider]  lidocaine (LIDODERM) 5 % Place 1 patch onto the skin daily. Remove & Discard patch within 12 hours or as directed by MD    [provider]  topiramate (TOPAMAX) 25 MG capsule Take 50 mg by mouth at bedtime.    [provider]  trolamine salicylate (ASPERCREME) 10 % cream Apply 1 application topically as needed for muscle pain. Patient used this medication for foot and shoulder pain.     [provider]  zolmitriptan (ZOMIG) 5 MG tablet Take 2.5-5 mg by  mouth as needed for migraine.     [provider]    Family History Family History  Problem Relation Age of Onset  . Diabetes Mother   . Cancer Mother        bladder  . Heart attack Father   . Heart disease Father   . Urolithiasis Father   . Colon cancer Neg Hx   . Colon polyps Neg Hx     Social History Social History   Tobacco Use  . Smoking status: Never Smoker  . Smokeless tobacco: Never Used  Substance Use Topics  . Alcohol use: No  . Drug use: No     Allergies   Prednisone; Iodine; and Penicillins   Review of Systems Review of Systems  Constitutional: Negative for chills, diaphoresis, fatigue and fever.  HENT: Negative for congestion, rhinorrhea and sneezing.   Eyes: Negative.   Respiratory:  Negative for cough, chest tightness and shortness of breath.   Cardiovascular: Positive for palpitations. Negative for chest pain and leg swelling.  Gastrointestinal: Negative for abdominal pain, blood in stool, diarrhea, nausea and vomiting.  Genitourinary: Negative for difficulty urinating, flank pain, frequency and hematuria.  Musculoskeletal: Negative for arthralgias and back pain.  Skin: Negative for rash.  Neurological: Positive for headaches. Negative for dizziness, speech difficulty, weakness and numbness.     Physical Exam Updated Vital Signs BP 131/89   Pulse 95   Temp 98.5 F (36.9 C) (Oral)   Resp (!) 23   Ht  (1.626 m)   Wt 127 kg   LMP  (LMP Unknown)   SpO2 93%   BMI 48.06 kg/m   Physical Exam Constitutional:      Appearance: She is well-developed.  HENT:     Head: Normocephalic and atraumatic.  Eyes:     Pupils: Pupils are equal, round, and reactive to light.  Neck:     Musculoskeletal: Normal range of motion and neck supple.  Cardiovascular:     Rate and Rhythm: Regular rhythm. Tachycardia present.     Heart sounds: Normal heart sounds.  Pulmonary:     Effort: Pulmonary effort is normal. No respiratory distress.     Breath sounds: Normal breath sounds. No wheezing or rales.  Chest:     Chest wall: No tenderness.  Abdominal:     General: Bowel sounds are normal.     Palpations: Abdomen is soft.     Tenderness: There is no abdominal tenderness. There is no guarding or rebound.  Musculoskeletal: Normal range of motion.  Lymphadenopathy:     Cervical: No cervical adenopathy.  Skin:    General: Skin is warm and dry.     Findings: No rash.  Neurological:     Mental Status: She is alert and oriented to person, place, and time.      ED Treatments / Results  Labs (all labs ordered are listed, but only abnormal results are displayed) Labs Reviewed - No data to display  EKG EKG Interpretation  Date/Time:  Friday September 25 2018 15:08:27 EDT  Ventricular Rate:  179 PR Interval:    QRS Duration: 90 QT Interval:  256 QTC Calculation: 442 R Axis:   62 Text Interpretation:  Supraventricular tachycardia Marked ST abnormality, possible inferior subendocardial injury Abnormal ECG Confirmed by Rolan Bucco [phone]) on 09/25/2018 4:03:26 PM   Radiology No results found.  Procedures .Cardioversion Date/Time: 09/25/2018 4:01 PM Performed by: Rolan Bucco, MD Authorized by: Rolan Bucco, MD   Consent:    Consent  obtained:  Verbal   Consent given by:  Patient   Risks discussed:  Pain and induced arrhythmia   Alternatives discussed:  No treatment Universal protocol:    Patient identity confirmed:  Verbally with patient Pre-procedure details:    Cardioversion basis:  Emergent   Rhythm:  Supraventricular tachycardia Patient sedated: No Post-procedure details:    Patient status:  Awake   Patient tolerance of procedure:  Tolerated well, no immediate complications Comments:     Patient had chemical cardioversion using adenosine.  She converted with one 6mg  dose into a sinus rhythm.  She remained in a sinus rhythm following this procedure.     (including critical care time) .procdo  Medications Ordered in ED Medications  adenosine (ADENOCARD) 6 MG/2ML injection (6 mg  Given 09/25/18 1528)     Initial Impression / Assessment and Plan / ED Course  I have reviewed the triage vital signs and the nursing notes.  Pertinent labs & imaging results that were available during my care of the patient were reviewed by me and considered in my medical decision making (see chart for details).        Patient was cardioverted with adenosine.  She tolerated the procedure well and has had no further symptoms.  She is feeling much better.  She was monitored for period of time and was discharged home in good condition.  She was encouraged to follow-up with her cardiologist.  Return precautions were given.  Final Clinical Impressions(s) /  ED Diagnoses   Final diagnoses:  SVT (supraventricular tachycardia) [facility])    ED Discharge Orders    None       Rolan Bucco, MD 09/25/18 [phone]

## 2018-09-25 NOTE — ED Triage Notes (Addendum)
Pt presents in SVT- hx of same. Pt denies chest pain, sob, dizziness.

## 2018-09-25 NOTE — ED Notes (Signed)
Pt placed on defib, cardiac monitor. Edp, toni RN, and [employer] EMT at bedside

## 2018-09-25 NOTE — ED Triage Notes (Signed)
Presents in SVT, Hx of same. HR 180. Dr. Fredderick Phenix aware

## 2018-09-25 NOTE — ED Notes (Signed)
Pt in normal sinus. HR 103

## 2018-11-03 ENCOUNTER — Telehealth: Payer: Self-pay | Admitting: Internal Medicine

## 2018-11-03 NOTE — Telephone Encounter (Signed)
New message    Spoke w/pt about appt scheduled for 05.19.20 with Dr. Ladona Ridgelaylor. Pt has a smart phone and will do video visit with Dr. Ladona Ridgelaylor. Phone number is listed in appt notes.      Virtual Visit Pre-Appointment Phone Call  "(Name), I am calling you today to discuss your upcoming appointment. We are currently trying to limit exposure to the virus that causes COVID-19 by seeing patients at home rather than in the office."  1. "What is the BEST phone number to call the day of the visit?" - include this in appointment notes  2. Do you have or have access to (through a family member/friend) a smartphone with video capability that we can use for your visit?" a. If yes - list this number in appt notes as cell (if different from BEST phone #) and list the appointment type as a VIDEO visit in appointment notes b. If no - list the appointment type as a PHONE visit in appointment notes  3. Confirm consent - "In the setting of the current Covid19 crisis, you are scheduled for a (phone or video) visit with your provider on (date) at (time).  Just as we do with many in-office visits, in order for you to participate in this visit, we must obtain consent.  If you'd like, I can send this to your mychart (if signed up) or email for you to review.  Otherwise, I can obtain your verbal consent now.  All virtual visits are billed to your insurance company just like a normal visit would be.  By agreeing to a virtual visit, we'd like you to understand that the technology does not allow for your provider to perform an examination, and thus may limit your provider's ability to fully assess your condition. If your provider identifies any concerns that need to be evaluated in person, we will make arrangements to do so.  Finally, though the technology is pretty good, we cannot assure that it will always work on either your or our end, and in the setting of a video visit, we may have to convert it to a phone-only visit.   In either situation, we cannot ensure that we have a secure connection.  Are you willing to proceed?" STAFF: Did the patient verbally acknowledge consent to telehealth visit? Document YES/NO here: YES  4. Advise patient to be prepared - "Two hours prior to your appointment, go ahead and check your blood pressure, pulse, oxygen saturation, and your weight (if you have the equipment to check those) and write them all down. When your visit starts, your provider will ask you for this information. If you have an Apple Watch or Kardia device, please plan to have heart rate information ready on the day of your appointment. Please have a pen and paper handy nearby the day of the visit as well."  5. Give patient instructions for MyChart download to smartphone OR Doximity/Doxy.me as below if video visit (depending on what platform provider is using)  6. Inform patient they will receive a phone call 15 minutes prior to their appointment time (may be from unknown caller ID) so they should be prepared to answer    TELEPHONE CALL NOTE  Jennifer Howard has been deemed a candidate for a follow-up tele-health visit to limit community exposure during the Covid-19 pandemic. I spoke with the patient via phone to ensure availability of phone/video source, confirm preferred email & phone number, and discuss instructions and expectations.  I reminded Jennifer Howard to  be prepared with any vital sign and/or heart rhythm information that could potentially be obtained via home monitoring, at the time of her visit. I reminded Jennifer Howard to expect a phone call prior to her visit.  Jennifer Howard 11/03/2018 3:56 PM   INSTRUCTIONS FOR DOWNLOADING THE MYCHART APP TO SMARTPHONE  - The patient must first make sure to have activated MyChart and know their login information - If Apple, go to [employer] and type in MyChart in the search bar and download the app. If Android, ask patient to go to [employer] and type in [place] in  the search bar and download the app. The app is free but as with any other app downloads, their phone may require them to verify saved payment information or Apple/Android password.  - The patient will need to then log into the app with their MyChart username and password, and select [facility] as their healthcare provider to link the account. When it is time for your visit, go to the MyChart app, find appointments, and click Begin Video Visit. Be sure to Select Allow for your device to access the Microphone and Camera for your visit. You will then be connected, and your provider will be with you shortly.  **If they have any issues connecting, or need assistance please contact MyChart service desk [phone])**  **If using a computer, in order to ensure the best quality for their visit they will need to use either of the following Internet Browsers: [employer] or Google Chrome**  IF USING DOXIMITY or DOXY.ME - The patient will receive a link just prior to their visit by text.     FULL LENGTH CONSENT FOR TELE-HEALTH VISIT   I hereby voluntarily request, consent and authorize [facility] and its employed or contracted physicians, physician assistants, nurse practitioners or other licensed health care professionals (the Practitioner), to provide me with telemedicine health care services (the Services") as deemed necessary by the treating Practitioner. I acknowledge and consent to receive the Services by the Practitioner via telemedicine. I understand that the telemedicine visit will involve communicating with the Practitioner through live audiovisual communication technology and the disclosure of certain medical information by electronic transmission. I acknowledge that I have been given the opportunity to request an in-person assessment or other available alternative prior to the telemedicine visit and am voluntarily participating in the telemedicine visit.  I understand that  I have the right to withhold or withdraw my consent to the use of telemedicine in the course of my care at any time, without affecting my right to future care or treatment, and that the Practitioner or I may terminate the telemedicine visit at any time. I understand that I have the right to inspect all information obtained and/or recorded in the course of the telemedicine visit and may receive copies of available information for a reasonable fee.  I understand that some of the potential risks of receiving the Services via telemedicine include:   Delay or interruption in medical evaluation due to technological equipment failure or disruption;  Information transmitted may not be sufficient (e.g. poor resolution of images) to allow for appropriate medical decision making by the Practitioner; and/or   In rare instances, security protocols could fail, causing a breach of personal health information.  Furthermore, I acknowledge that it is my responsibility to provide information about my medical history, conditions and care that is complete and accurate to the best of my ability. I acknowledge that Practitioner's advice,  recommendations, and/or decision may be based on factors not within their control, such as incomplete or inaccurate data provided by me or distortions of diagnostic images or specimens that may result from electronic transmissions. I understand that the practice of medicine is not an exact science and that Practitioner makes no warranties or guarantees regarding treatment outcomes. I acknowledge that I will receive a copy of this consent concurrently upon execution via email to the email address I last provided but may also request a printed copy by calling the office of [facility].    I understand that my insurance will be billed for this visit.   I have read or had this consent read to me.  I understand the contents of this consent, which adequately explains the benefits and risks of  the Services being provided via telemedicine.   I have been provided ample opportunity to ask questions regarding this consent and the Services and have had my questions answered to my satisfaction.  I give my informed consent for the services to be provided through the use of telemedicine in my medical care  By participating in this telemedicine visit I agree to the above.

## 2018-11-10 ENCOUNTER — Telehealth (INDEPENDENT_AMBULATORY_CARE_PROVIDER_SITE_OTHER): Payer: Self-pay | Admitting: Internal Medicine

## 2018-11-10 ENCOUNTER — Other Ambulatory Visit: Payer: Self-pay

## 2018-11-10 DIAGNOSIS — I471 Supraventricular tachycardia: Secondary | ICD-10-CM

## 2018-11-10 MED ORDER — METOPROLOL TARTRATE 25 MG PO TABS: ORAL_TABLET | ORAL | 11 refills | Status: AC

## 2018-11-10 NOTE — Progress Notes (Signed)
Electrophysiology TeleHealth Note   Due to national recommendations of social distancing due to COVID 19, an audio/video telehealth visit is felt to be most appropriate for this patient at this time.  See MyChart message from today for the patient's consent to telehealth for [facility].   Date:  11/10/2018   ID:  Jennifer GrossErin Girgis, DOB [birth year], MRN [id number]  Location: patient's home  Provider location[place] [address], [place]  Evaluation Performed: Follow-up visit  PCP:  Patient, [name]  Cardiologist:  No primary care provider on file.  Electrophysiologist:  Dr Ladona Ridgelaylor  Chief Complaint:  "I've been doing ok."  History of Present Illness:    Jennifer Grossrin Howard is a 53 y.o. female who presents via audio/video conferencing for a telehealth visit today. She has a h/o SVT and underwent EP study and ablation almost 3 years ago. She has occaisional breakthroughs and underwent a visit to the ED last month requiring IV adenosine. She has done well since. The patient is otherwise without complaint today.  The patient denies symptoms of fevers, chills, cough, or new SOB worrisome for COVID 19.  Past Medical History:  Diagnosis Date  . Arthritis    "little; in my back, right foot" (04/12/2016)  . Chronic lower back pain   . Depression   . GERD (gastroesophageal reflux disease)   . HTN (hypertension)   . Migraine    "twice/week" (04/12/2016)  . Obesity   . SVT (supraventricular tachycardia) (HCC)    a. s/p RFCA on 04/12/16    Past Surgical History:  Procedure Laterality Date  . ELECTROPHYSIOLOGIC STUDY N/A 04/12/2016   Procedure: SVT Ablation;  Surgeon: Marinus MawGregg W , MD;  [facility];  Service: Cardiovascular;  Laterality: N/A;  . HEMANGIOMA EXCISION  1972 - 1990   "right breast"  . REDUCTION MAMMAPLASTY Left 1990   "to make it the same size as the right"  . TOE SURGERY Right ~ 1985   "removal of tumor in toe"    Current Outpatient Medications   Medication Sig Dispense Refill  . chlorpheniramine (CHLOR-TRIMETON) 4 MG tablet Take 4 mg by mouth daily as needed for allergies.    . citalopram (CELEXA) 10 MG tablet Take 10 mg by mouth daily.    . DULoxetine (CYMBALTA) 60 MG capsule Take 120 mg by mouth every evening.     . fluticasone (FLONASE) 50 MCG/ACT nasal spray Place 2 sprays into the nose daily as needed for allergies.     Marland Kitchen. ibuprofen (ADVIL,MOTRIN) 200 MG tablet Take 600 mg by mouth every 6 (six) hours as needed for moderate pain. Patient used this medication for pain.     Marland Kitchen. lamoTRIgine (LAMICTAL) 200 MG tablet Take 400 mg by mouth daily.    Marland Kitchen. lidocaine (LIDODERM) 5 % Place 1 patch onto the skin daily. Remove & Discard patch within 12 hours or as directed by MD    . topiramate (TOPAMAX) 25 MG capsule Take 50 mg by mouth at bedtime.    . trolamine salicylate (ASPERCREME) 10 % cream Apply 1 application topically as needed for muscle pain. Patient used this medication for foot and shoulder pain.     Marland Kitchen. zolmitriptan (ZOMIG) 5 MG tablet Take 2.5-5 mg by mouth as needed for migraine.      No current facility-administered medications for this visit.     Allergies:   Prednisone; Iodine; and Penicillins   Social History:  The patient  reports that she has  never smoked. She has never used smokeless tobacco. She reports that she does not drink alcohol or use drugs.   Family History:  The patient's family history includes Cancer in her mother; Diabetes in her mother; Heart attack in her father; Heart disease in her father; Urolithiasis in her father.   ROS:  Please see the history of present illness.   All other systems are personally reviewed and negative.    Exam:    Vital Signs:  LMP  (LMP Unknown)   Well appearing, alert and conversant, regular work of breathing,  good skin color Eyes- anicteric, neuro- grossly intact, skin- no apparent rash or lesions or cyanosis, mouth- oral mucosa is pink   Labs/Other Tests and Data Reviewed:     Recent Labs: No results found for requested labs within last 8760 hours.   Wt Readings from Last 3 Encounters:  09/25/18 280 lb (127 kg)  01/23/18 290 lb (131.5 kg)  12/15/17 290 lb (131.5 kg)     Other studies personally reviewed: ER visit 09/25/18   ASSESSMENT & PLAN:    1.  SVT - I have reviewed the treatment options. She will be prescribed metoprolol to take when she goes into SVT.  2. COVID 19 screen The patient denies symptoms of COVID 19 at this time.  The importance of social distancing was discussed today.  Follow-up:  6 months Next remote: n/a  Current medicines are reviewed at length with the patient today.   The patient does not have concerns regarding her medicines.  The following changes were made today:  none  Labs/ tests ordered today include: none No orders of the defined types were placed in this encounter.    Patient Risk:  after full review of this patients clinical status, I feel that they are at moderate risk at this time.  Today, I have spent 15 minutes with the patient with telehealth technology discussing all of the above .    Signed, Lewayne Bunting, MD  11/10/2018 9:18 AM     [facility] [address] [place] [address] [phone] (office) [phone] (fax)

## 2019-12-08 ENCOUNTER — Emergency Department (HOSPITAL_BASED_OUTPATIENT_CLINIC_OR_DEPARTMENT_OTHER): Payer: Self-pay

## 2019-12-08 ENCOUNTER — Encounter (HOSPITAL_BASED_OUTPATIENT_CLINIC_OR_DEPARTMENT_OTHER): Payer: Self-pay | Admitting: Emergency Medicine

## 2019-12-08 ENCOUNTER — Emergency Department (HOSPITAL_BASED_OUTPATIENT_CLINIC_OR_DEPARTMENT_OTHER)
Admission: EM | Admit: 2019-12-08 | Discharge: 2019-12-09 | Disposition: A | Discharge status: Home / Self Care | Payer: Self-pay | Attending: Emergency Medicine | Admitting: Emergency Medicine

## 2019-12-08 ENCOUNTER — Other Ambulatory Visit: Payer: Self-pay

## 2019-12-08 DIAGNOSIS — I471 Supraventricular tachycardia: Secondary | ICD-10-CM | POA: Insufficient documentation

## 2019-12-08 DIAGNOSIS — W19XXXA Unspecified fall, initial encounter: Secondary | ICD-10-CM

## 2019-12-08 DIAGNOSIS — M25462 Effusion, left knee: Secondary | ICD-10-CM | POA: Insufficient documentation

## 2019-12-08 DIAGNOSIS — I10 Essential (primary) hypertension: Secondary | ICD-10-CM | POA: Insufficient documentation

## 2019-12-08 DIAGNOSIS — Y929 Unspecified place or not applicable: Secondary | ICD-10-CM | POA: Insufficient documentation

## 2019-12-08 DIAGNOSIS — S8992XA Unspecified injury of left lower leg, initial encounter: Secondary | ICD-10-CM

## 2019-12-08 DIAGNOSIS — M1712 Unilateral primary osteoarthritis, left knee: Secondary | ICD-10-CM | POA: Insufficient documentation

## 2019-12-08 DIAGNOSIS — Z79899 Other long term (current) drug therapy: Secondary | ICD-10-CM | POA: Insufficient documentation

## 2019-12-08 DIAGNOSIS — Y939 Activity, unspecified: Secondary | ICD-10-CM | POA: Insufficient documentation

## 2019-12-08 DIAGNOSIS — Y999 Unspecified external cause status: Secondary | ICD-10-CM | POA: Insufficient documentation

## 2019-12-08 DIAGNOSIS — W1842XA Slipping, tripping and stumbling without falling due to stepping into hole or opening, initial encounter: Secondary | ICD-10-CM | POA: Insufficient documentation

## 2019-12-08 DIAGNOSIS — S0990XA Unspecified injury of head, initial encounter: Secondary | ICD-10-CM

## 2019-12-08 NOTE — ED Notes (Signed)
ED Provider at bedside. 

## 2019-12-08 NOTE — ED Triage Notes (Signed)
Pt here after fall this afternoon. Hit head and left knee. Pain mostly on left side of head.

## 2019-12-08 NOTE — ED Provider Notes (Signed)
[facility] EMERGENCY DEPARTMENT Provider Note   CSN: [id number] Arrival date & time: 12/08/19  1843     History Chief Complaint  Patient presents with  . Fall    Jennifer Howard is a 54 y.o. female history of obesity, GERD, hypertension, SVT.  Patient presents today after a fall.  At 4 PM she was getting out of her car when she stepped onto a curb and slipped on some pine needles.  This caused her to fall directly onto her left knee.  She reports an immediate onset of left knee pain severe constant nonradiating worsened with palpation, sharp ache in nature.  Pain improved with rest and elevation in the ER.  Additionally patient reports striking the left side of her head with her cane, questionable loss of consciousness.  She reports left-sided headache and left-sided jaw pain since that time described as a moderate throbbing sensation constant nonradiating worsened with moving the jaw improved with rest.  Denies blood thinner use, vision changes, neck pain, chest pain/shortness of breath, abdominal pain, back pain, numbness/tingling, weakness or any additional concerns.  HPI     Past Medical History:  Diagnosis Date  . Arthritis    "little; in my back, right foot" (04/12/2016)  . Chronic lower back pain   . Depression   . GERD (gastroesophageal reflux disease)   . HTN (hypertension)   . Migraine    "twice/week" (04/12/2016)  . Obesity   . SVT (supraventricular tachycardia) (HCC)    a. s/p RFCA on 04/12/16    Patient Active Problem List   Diagnosis Date Noted  . SVT (supraventricular tachycardia) (HCC) 04/12/2016  . HTN (hypertension)   . Morbid obesity (HCC) 10/14/2014    Past Surgical History:  Procedure Laterality Date  . ELECTROPHYSIOLOGIC STUDY N/A 04/12/2016   Procedure: SVT Ablation;  Surgeon: Marinus Maw, MD;  Location: [facility] INVASIVE CV LAB;  Service: Cardiovascular;  Laterality: N/A;  . HEMANGIOMA EXCISION  1972 - 1990   "right breast"  . REDUCTION  MAMMAPLASTY Left 1990   "to make it the same size as the right"  . TOE SURGERY Right ~ 1985   "removal of tumor in toe"     OB History   No obstetric history on file.     Family History  Problem Relation Age of Onset  . Diabetes Mother   . Cancer Mother        bladder  . Heart attack Father   . Heart disease Father   . Urolithiasis Father   . Colon cancer Neg Hx   . Colon polyps Neg Hx     Social History   Tobacco Use  . Smoking status: Never Smoker  . Smokeless tobacco: Never Used  Substance Use Topics  . Alcohol use: No  . Drug use: No    Home Medications Prior to Admission medications   Medication Sig Start Date End Date Taking? Authorizing Provider  chlorpheniramine (CHLOR-TRIMETON) 4 MG tablet Take 4 mg by mouth daily as needed for allergies.    [provider]  citalopram (CELEXA) 10 MG tablet Take 10 mg by mouth daily.    [provider]  DULoxetine (CYMBALTA) 60 MG capsule Take 120 mg by mouth every evening.     [provider]  fluticasone (FLONASE) 50 MCG/ACT nasal spray Place 2 sprays into the nose daily as needed for allergies.     [provider]  ibuprofen (ADVIL,MOTRIN) 200 MG tablet Take 600 mg by mouth every  6 (six) hours as needed for moderate pain. Patient used this medication for pain.     [provider]  lamoTRIgine (LAMICTAL) 200 MG tablet Take 400 mg by mouth daily.    [provider]  lidocaine (LIDODERM) 5 % Place 1 patch onto the skin daily. Remove & Discard patch within 12 hours or as directed by MD    [provider]  metoprolol tartrate (LOPRESSOR) 25 MG tablet Take one tablet as needed for heart racing.  May take an additional tablet after 30 minutes if needed. 11/10/18   Evans Lance, MD  topiramate (TOPAMAX) 25 MG capsule Take 50 mg by mouth at bedtime.    [provider]  trolamine salicylate (ASPERCREME) 10 % cream Apply 1 application topically as needed for muscle  pain. Patient used this medication for foot and shoulder pain.     [provider]  zolmitriptan (ZOMIG) 5 MG tablet Take 2.5-5 mg by mouth as needed for migraine.     [provider]    Allergies    Prednisone, Iodine, and Penicillins  Review of Systems   Review of Systems  Constitutional: Negative.  Negative for chills and fever.  Cardiovascular: Negative.  Negative for chest pain.  Gastrointestinal: Negative.  Negative for abdominal pain, nausea and vomiting.  Musculoskeletal: Positive for arthralgias. Negative for neck pain.  Skin: Negative.  Negative for wound.  Neurological: Positive for headaches. Negative for weakness and numbness.     Physical Exam Updated Vital Signs BP 137/88 (BP Location: Right Arm)   Pulse 73   Temp 98.3 F (36.8 C)   Resp 16   LMP  (LMP Unknown)   SpO2 100%   Physical Exam Constitutional:      General: She is not in acute distress.    Appearance: Normal appearance. She is well-developed. She is not ill-appearing or diaphoretic.  HENT:     Head: Normocephalic and atraumatic.  Eyes:     General: Vision grossly intact. Gaze aligned appropriately.     Pupils: Pupils are equal, round, and reactive to light.  Neck:     Trachea: Trachea and phonation normal.  Cardiovascular:     Rate and Rhythm: Normal rate and regular rhythm.     Pulses:          Dorsalis pedis pulses are 2+ on the right side and 2+ on the left side.  Pulmonary:     Effort: Pulmonary effort is normal. No respiratory distress.  Abdominal:     General: There is no distension.     Palpations: Abdomen is soft.     Tenderness: There is no abdominal tenderness. There is no guarding or rebound.  Musculoskeletal:        General: Normal range of motion.     Cervical back: Normal range of motion.     Comments: Left knee: Ecchymosis and swelling present.  Straight leg raise intact.  Tenderness diffusely over the patella area.  Examination limited by body  habitus. - Prep range of motion and strength of the left ankle and left hip without pain.  Feet:     Right foot:     Protective Sensation: 3 sites tested. 3 sites sensed.     Left foot:     Protective Sensation: 3 sites tested. 3 sites sensed.  Skin:    General: Skin is warm and dry.  Neurological:     Mental Status: She is alert.     GCS: GCS eye subscore is  4. GCS verbal subscore is 5. GCS motor subscore is 6.     Comments: Speech is clear and goal oriented, follows commands Major Cranial nerves without deficit, no facial droop Moves extremities without ataxia, coordination intact  Psychiatric:        Behavior: Behavior normal.     ED Results / Procedures / Treatments   Labs (all labs ordered are listed, but only abnormal results are displayed) Labs Reviewed - No data to display  EKG None  Radiology DG Knee Complete 4 Views Left  Result Date: 12/08/2019 CLINICAL DATA:  Larey Seat, lateral bruising, pain EXAM: LEFT KNEE - COMPLETE 4+ VIEW COMPARISON:  12/15/2017 FINDINGS: Frontal, bilateral oblique, lateral views of the left knee are obtained. There is progressive 3 compartmental osteoarthritis greatest in the medial compartment with moderate to severe joint space narrowing and osteophyte formation. No fracture, subluxation, or dislocation. Moderate joint effusion. Soft tissues are unremarkable. IMPRESSION: 1. No acute fracture. 2. 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 3. Moderate joint effusion. Electronically Signed   By: Sharlet Salina M.D.   On: 12/08/2019 21:17    Procedures Procedures (including critical care time)  Medications Ordered in ED Medications - No data to display  ED Course  I have reviewed the triage vital signs and the nursing notes.  Pertinent labs & imaging results that were available during my care of the patient were reviewed by me and considered in my medical decision making (see chart for details).     MDM Rules/Calculators/A&P                          Additional History Obtained: 1. Nursing notes from this visit. - DG left knee:  IMPRESSION:  1. No acute fracture.  2. Progressive 3 compartmental osteoarthritis, moderate to severe in  the medial compartment.  3. Moderate joint effusion    Patient reporting left-sided headache, left-sided trismus after hitting her head during the fall.  I ordered CT head, max face and cervical spine.  Additionally due to patient's mechanism of fall and body habitus concern for possible plateau injury, CT left knee ordered.  Care handoff given to Dr. Read Drivers at shift change. Plan is to follow-up on imaging. Disposition per oncoming team.  Note: Portions of this report may have been transcribed using voice recognition software. Every effort was made to ensure accuracy; however, inadvertent computerized transcription errors may still be present. Final Clinical Impression(s) / ED Diagnoses Final diagnoses:  None    Rx / DC Orders ED Discharge Orders    None       Elizabeth Palau 12/08/19 2340    Tegeler, Canary Brim, MD 12/08/19 2352

## 2019-12-09 NOTE — ED Provider Notes (Addendum)
Nursing notes and vitals signs, including pulse oximetry, reviewed.  Summary of this visit's results, reviewed by myself:  EKG:  EKG Interpretation  Date/Time:    Ventricular Rate:    PR Interval:    QRS Duration:   QT Interval:    QTC Calculation:   R Axis:     Text Interpretation:         Labs:  No results found for this or any previous visit (from the past 24 hour(s)).  Imaging Studies: CT Head Wo Contrast  Result Date: 12/09/2019 CLINICAL DATA:  Head trauma EXAM: CT HEAD WITHOUT CONTRAST TECHNIQUE: Contiguous axial images were obtained from the base of the skull through the vertex without intravenous contrast. COMPARISON:  None FINDINGS: Brain: Mild volume loss. No acute intracranial abnormality. Specifically, no hemorrhage, hydrocephalus, mass lesion, acute infarction, or significant intracranial injury. Vascular: No hyperdense vessel or unexpected calcification. Skull: No acute calvarial abnormality. Sinuses/Orbits: Mucosal thickening throughout the paranasal sinuses. Orbital soft tissues unremarkable. Other: None IMPRESSION: No acute intracranial abnormality.  Mild volume loss. Electronically Signed   By: Charlett Nose M.D.   On: 12/09/2019 00:52   CT Cervical Spine Wo Contrast  Result Date: 12/09/2019 CLINICAL DATA:  Trauma.  Headache. EXAM: CT CERVICAL SPINE WITHOUT CONTRAST TECHNIQUE: Multidetector CT imaging of the cervical spine was performed without intravenous contrast. Multiplanar CT image reconstructions were also generated. COMPARISON:  None. FINDINGS: Alignment: Normal. Skull base and vertebrae: No acute fracture. No primary bone lesion or focal pathologic process. Soft tissues and spinal canal: No prevertebral fluid or swelling. No visible canal hematoma. Disc levels: Diffuse degenerative disc disease and facet disease, moderate to advanced. Upper chest: No acute findings Other: None IMPRESSION: Diffuse moderate to advanced degenerative disc and facet disease. No acute  bony abnormality. Electronically Signed   By: Charlett Nose M.D.   On: 12/09/2019 00:55   CT Knee Left Wo Contrast  Result Date: 12/09/2019 CLINICAL DATA:  Knee pain, fracture EXAM: CT OF THE left KNEE WITHOUT CONTRAST TECHNIQUE: Multidetector CT imaging of the left knee was performed according to the standard protocol. Multiplanar CT image reconstructions were also generated. COMPARISON:  None. FINDINGS: Bones/Joint/Cartilage There is a minimally displaced ossicle seen adjacent to the medial tibial plateau which is an age indeterminate avulsion injury. Tricompartmental osteoarthritis is noted most notable in the medial compartment joint space loss and subchondral cystic changes. A moderate knee joint effusion is present. Ligaments Suboptimally assessed by CT. There does however appear to be heterogeneous appearance and thickening of the medial collateral ligament, likely due to intrasubstance injury. Muscles and Tendons The muscles surrounding the knee appear to be intact without evidence of focal atrophy or tear. The patellar and quadriceps tendon are intact. Soft tissues Mild prepatellar subcutaneous edema is noted. IMPRESSION: Age-indeterminate avulsion injury off the medial tibial plateau, new since prior radiograph of December 15, 2017. There is heterogeneous thickening of the medial collateral ligament, likely due to intrasubstance sprain. moderate knee joint effusion. Electronically Signed   By: Jonna Clark M.D.   On: 12/09/2019 00:57   DG Knee Complete 4 Views Left  Result Date: 12/08/2019 CLINICAL DATA:  Larey Seat, lateral bruising, pain EXAM: LEFT KNEE - COMPLETE 4+ VIEW COMPARISON:  12/15/2017 FINDINGS: Frontal, bilateral oblique, lateral views of the left knee are obtained. There is progressive 3 compartmental osteoarthritis greatest in the medial compartment with moderate to severe joint space narrowing and osteophyte formation. No fracture, subluxation, or dislocation. Moderate joint effusion. Soft  tissues are unremarkable. IMPRESSION:  1. No acute fracture. 2. 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 3. Moderate joint effusion. Electronically Signed   By: Randa Ngo M.D.   On: 12/08/2019 21:17   CT Maxillofacial Wo Contrast  Result Date: 12/09/2019 CLINICAL DATA:  Facial trauma EXAM: CT MAXILLOFACIAL WITHOUT CONTRAST TECHNIQUE: Multidetector CT imaging of the maxillofacial structures was performed. Multiplanar CT image reconstructions were also generated. COMPARISON:  None. FINDINGS: Osseous: No fracture or mandibular dislocation. No destructive process. Orbits: Negative. No traumatic or inflammatory finding. Sinuses: Mucosal thickening throughout the paranasal sinuses. No air-fluid levels. Soft tissues: Negative Limited intracranial: No acute findings IMPRESSION: No evidence of facial or orbital fracture. Chronic sinusitis. Electronically Signed   By: Rolm Baptise M.D.   On: 12/09/2019 00:56   1:24 AM No medial tibial tenderness so I suspect the avulsion fracture seen on CT is subacute.  The patient does not wish any prescriptions for pain. She was concerned primarily about a head injury and she was reassured that no abnormalities were seen on her CT scans.    Medical screening examination/treatment/procedure(s) were conducted as a shared visit with non-physician practitioner(s) and myself.  I personally evaluated the patient during the encounter.      Gabryela Kimbrell, MD 12/09/19 0125    Shanon Rosser, MD 12/09/19 [phone]

## 2020-02-03 ENCOUNTER — Ambulatory Visit: Payer: Self-pay | Admitting: Neurology

## 2020-03-20 ENCOUNTER — Emergency Department (HOSPITAL_BASED_OUTPATIENT_CLINIC_OR_DEPARTMENT_OTHER)
Admission: EM | Admit: 2020-03-20 | Discharge: 2020-03-20 | Disposition: A | Discharge status: Home / Self Care | Payer: Self-pay | Attending: Emergency Medicine | Admitting: Emergency Medicine

## 2020-03-20 ENCOUNTER — Other Ambulatory Visit: Payer: Self-pay

## 2020-03-20 ENCOUNTER — Encounter (HOSPITAL_BASED_OUTPATIENT_CLINIC_OR_DEPARTMENT_OTHER): Payer: Self-pay | Admitting: Emergency Medicine

## 2020-03-20 ENCOUNTER — Emergency Department (HOSPITAL_BASED_OUTPATIENT_CLINIC_OR_DEPARTMENT_OTHER): Payer: Self-pay

## 2020-03-20 DIAGNOSIS — Z79899 Other long term (current) drug therapy: Secondary | ICD-10-CM | POA: Insufficient documentation

## 2020-03-20 DIAGNOSIS — I10 Essential (primary) hypertension: Secondary | ICD-10-CM | POA: Insufficient documentation

## 2020-03-20 DIAGNOSIS — I471 Supraventricular tachycardia, unspecified: Secondary | ICD-10-CM

## 2020-03-20 LAB — CBC
HCT: 45.7 % (ref 36.0–46.0)
Hemoglobin: 14.9 g/dL (ref 12.0–15.0)
MCH: 30 pg (ref 26.0–34.0)
MCHC: 32.6 g/dL (ref 30.0–36.0)
MCV: 92.1 fL (ref 80.0–100.0)
Platelets: 374 10*3/uL (ref 150–400)
RBC: 4.96 MIL/uL (ref 3.87–5.11)
RDW: 13.2 % (ref 11.5–15.5)
WBC: 13.6 10*3/uL — ABNORMAL HIGH (ref 4.0–10.5)
nRBC: 0 % (ref 0.0–0.2)

## 2020-03-20 LAB — COMPREHENSIVE METABOLIC PANEL
ALT: 12 U/L (ref 0–44)
AST: 13 U/L — ABNORMAL LOW (ref 15–41)
Albumin: 4.7 g/dL (ref 3.5–5.0)
Alkaline Phosphatase: 70 U/L (ref 38–126)
Anion gap: 11 (ref 5–15)
BUN: 14 mg/dL (ref 6–20)
CO2: 26 mmol/L (ref 22–32)
Calcium: 9 mg/dL (ref 8.9–10.3)
Chloride: 105 mmol/L (ref 98–111)
Creatinine, Ser: 1.03 mg/dL — ABNORMAL HIGH (ref 0.44–1.00)
GFR calc Af Amer: >60 mL/min (ref >60)
GFR calc non Af Amer: >60 mL/min (ref >60)
Glucose, Bld: 123 mg/dL — ABNORMAL HIGH (ref 70–99)
Potassium: 3.8 mmol/L (ref 3.5–5.1)
Sodium: 142 mmol/L (ref 135–145)
Total Bilirubin: 0.3 mg/dL (ref 0.3–1.2)
Total Protein: 7.8 g/dL (ref 6.5–8.1)

## 2020-03-20 MED ORDER — SODIUM CHLORIDE 0.9 % IV BOLUS
1000.0000 mL | Freq: Once | INTRAVENOUS | Status: AC
  Administered 2020-03-20: 1000 mL via INTRAVENOUS

## 2020-03-20 MED ORDER — ADENOSINE 6 MG/2ML IV SOLN
INTRAVENOUS | Status: AC
  Filled 2020-03-20: qty 6

## 2020-03-20 NOTE — ED Triage Notes (Signed)
States has had a rapid HB x 3 hrs . Hx of SVT

## 2020-03-20 NOTE — Discharge Instructions (Addendum)
Continue taking your medications as prescribed. Make sure stay well-hydrated with water. Follow-up with your cardiologist regarding your episode of SVT today. Return to the emergency room if you develop chest pain, shortness of breath, dizziness, or any new, worsening, or concerning symptoms.

## 2020-03-20 NOTE — ED Provider Notes (Signed)
[facility] EMERGENCY DEPARTMENT Provider Note   CSN: [id number] Arrival date & time: 03/20/20  1904     History Chief Complaint  Patient presents with  . Palpitations    Jennifer Howard is a 54 y.o. female presenting for evaluation of chest tightness.  Patient states that the past 3 hours, she has noted that she has had a rapid heart rate.  She reports chest tightness, headache, and sweatiness, which is how she feels when she goes into SVT.  She reports a history of similar, usually needs adenosine to fix it.  She has been using vagal maneuvers at home without improvement.  She did take 3 of her as needed metoprolol, does not feel she has had much relief with this.  She denies recent fever, chills, cough, shortness breath, nausea, vomiting abdominal pain, urinary symptoms, normal bowel movements.  She has received both Covid vaccines.  She had an ablation in 2017 for SVT.  She is not on blood thinners.  She is not remember who she follows with with cardiology.  Additional history obtained from chart review.  Patient with a history of depression, GERD, hypertension, SVT.  She follows with Dr. Ladona Ridgel from cardiology.  HPI     Past Medical History:  Diagnosis Date  . Arthritis    "little; in my back, right foot" (04/12/2016)  . Chronic lower back pain   . Depression   . GERD (gastroesophageal reflux disease)   . HTN (hypertension)   . Migraine    "twice/week" (04/12/2016)  . Obesity   . SVT (supraventricular tachycardia) (HCC)    a. s/p RFCA on 04/12/16    Patient Active Problem List   Diagnosis Date Noted  . SVT (supraventricular tachycardia) (HCC) 04/12/2016  . HTN (hypertension)   . Morbid obesity (HCC) 10/14/2014    Past Surgical History:  Procedure Laterality Date  . ELECTROPHYSIOLOGIC STUDY N/A 04/12/2016   Procedure: SVT Ablation;  Surgeon: Marinus Maw, MD;  Location: [facility] INVASIVE CV LAB;  Service: Cardiovascular;  Laterality: N/A;  . HEMANGIOMA EXCISION   1972 - 1990   "right breast"  . REDUCTION MAMMAPLASTY Left 1990   "to make it the same size as the right"  . TOE SURGERY Right ~ 1985   "removal of tumor in toe"     OB History   No obstetric history on file.     Family History  Problem Relation Age of Onset  . Diabetes Mother   . Cancer Mother        bladder  . Heart attack Father   . Heart disease Father   . Urolithiasis Father   . Colon cancer Neg Hx   . Colon polyps Neg Hx     Social History   Tobacco Use  . Smoking status: Never Smoker  . Smokeless tobacco: Never Used  Substance Use Topics  . Alcohol use: No  . Drug use: No    Home Medications Prior to Admission medications   Medication Sig Start Date End Date Taking? Authorizing Provider  chlorpheniramine (CHLOR-TRIMETON) 4 MG tablet Take 4 mg by mouth daily as needed for allergies.    [provider]  citalopram (CELEXA) 10 MG tablet Take 10 mg by mouth daily.    [provider]  DULoxetine (CYMBALTA) 60 MG capsule Take 120 mg by mouth every evening.     [provider]  fluticasone (FLONASE) 50 MCG/ACT nasal spray Place 2 sprays into the nose daily as needed for allergies.  [provider]  ibuprofen (ADVIL,MOTRIN) 200 MG tablet Take 600 mg by mouth every 6 (six) hours as needed for moderate pain. Patient used this medication for pain.     [provider]  lamoTRIgine (LAMICTAL) 200 MG tablet Take 400 mg by mouth daily.    [provider]  lidocaine (LIDODERM) 5 % Place 1 patch onto the skin daily. Remove & Discard patch within 12 hours or as directed by MD    [provider]  metoprolol tartrate (LOPRESSOR) 25 MG tablet Take one tablet as needed for heart racing.  May take an additional tablet after 30 minutes if needed. 11/10/18   Marinus Maw, MD  topiramate (TOPAMAX) 25 MG capsule Take 50 mg by mouth at bedtime.    [provider]  trolamine salicylate (ASPERCREME) 10 % cream Apply  1 application topically as needed for muscle pain. Patient used this medication for foot and shoulder pain.     [provider]  zolmitriptan (ZOMIG) 5 MG tablet Take 2.5-5 mg by mouth as needed for migraine.     [provider]    Allergies    Prednisone, Iodine, and Penicillins  Review of Systems   Review of Systems  Respiratory: Positive for chest tightness.   Cardiovascular: Positive for palpitations.  Neurological: Positive for headaches.  All other systems reviewed and are negative.   Physical Exam Updated Vital Signs BP 110/88 (BP Location: Right Wrist)   Pulse 79   Resp 20   LMP  (LMP Unknown)   SpO2 99%   Physical Exam Vitals and nursing note reviewed.  Constitutional:      General: She is not in acute distress.    Appearance: She is well-developed. She is obese.     Comments: Appears in no distress  HENT:     Head: Normocephalic and atraumatic.  Eyes:     Extraocular Movements: Extraocular movements intact.     Conjunctiva/sclera: Conjunctivae normal.     Pupils: Pupils are equal, round, and reactive to light.  Cardiovascular:     Rate and Rhythm: Regular rhythm. Tachycardia present.     Pulses: Normal pulses.     Comments: Tachycardic around 140 Pulmonary:     Effort: Pulmonary effort is normal. No respiratory distress.     Breath sounds: Normal breath sounds. No wheezing.     Comments: Speaking in full sentences.  Clear lung sounds in all fields. Abdominal:     General: There is no distension.     Palpations: Abdomen is soft. There is no mass.     Tenderness: There is no abdominal tenderness. There is no guarding or rebound.  Musculoskeletal:        General: Normal range of motion.     Cervical back: Normal range of motion and neck supple.     Right lower leg: No edema.     Left lower leg: No edema.  Skin:    General: Skin is warm and dry.     Capillary Refill: Capillary refill takes less than 2 seconds.  Neurological:     Mental  Status: She is alert and oriented to person, place, and time.     ED Results / Procedures / Treatments   Labs (all labs ordered are listed, but only abnormal results are displayed) Labs Reviewed  CBC - Abnormal; Notable for the following components:      Result Value   WBC 13.6 (*)    All other components within normal limits  COMPREHENSIVE METABOLIC PANEL - Abnormal; Notable for the following components:   Glucose, Bld 123 (*)    Creatinine, Ser 1.03 (*)    AST 13 (*)    All other components within normal limits    EKG EKG Interpretation  Date/Time:  Monday March 20 2020 19:27:18 EDT Ventricular Rate:  97 PR Interval:    QRS Duration: 93 QT Interval:  352 QTC Calculation: 448 R Axis:   76 Text Interpretation: Sinus rhythm since previous tracing, converted from SVT to sinus rhythm Confirmed by Frederick Peers [phone]) on 03/20/2020 7:54:48 PM   Radiology DG Chest Portable 1 View  Result Date: 03/20/2020 CLINICAL DATA:  SVT, tachycardia EXAM: PORTABLE CHEST 1 VIEW COMPARISON:  Radiograph 08/29/2013 FINDINGS: Low lung volumes with central vascular congestion. No consolidation, features of edema, pneumothorax, or effusion. Prominence the cardiac silhouette likely accentuated by the portable technique and low volumes. The cardiomediastinal contours are unremarkable. No acute osseous or soft tissue abnormality. Telemetry leads overlie the chest. IMPRESSION: Low lung volumes with central vascular congestion. No frank edema or effusion. Electronically Signed   By: Kreg Shropshire M.D.   On: 03/20/2020 19:54    Procedures Procedures (including critical care time)  Medications Ordered in ED Medications  adenosine (ADENOCARD) 6 MG/2ML injection (has no administration in time range)  sodium chloride 0.9 % bolus 1,000 mL ( Intravenous Stopped 03/20/20 2030)    ED Course  I have reviewed the triage vital signs and the nursing notes.  Pertinent labs & imaging results that were  available during my care of the patient were reviewed by me and considered in my medical decision making (see chart for details).    MDM Rules/Calculators/A&P                          Patient presenting for evaluation of several hours of feeling when she is in SVT.  Upon arrival, patient is tachycardic around 140.  She reports chest tightness, no chest pain.  She has used vagal maneuvers and metoprolol at home without improvement.  Blood pressure is stable.  Will start fluids and plan to give adenosine.  Patient converted to normal sinus rhythm prior to giving adenosine.  On reevaluation, she reports she continues to have some mild chest tightness, which she attributes to the several hours of SVT.  Will obtain labs, chest x-ray, continue to monitor.  Labs interpreted by me, overall reassuring.  Chest x-ray without signs of infection.  Patient remains in normal sinus rhythm.  1 hour after observation, patient report symptoms have completely resolved.  No further chest tightness, headaches, feeling of palpitations.  She states she feels ready to go home.  I encouraged her to follow-up with her cardiologist.  At this time, patient appears safe for discharge.  Return precautions given.  Patient states she understands and agrees to plan  Final Clinical Impression(s) / ED Diagnoses Final diagnoses:  SVT (supraventricular tachycardia) [facility])    Rx / DC Orders ED Discharge Orders    None       Alveria Apley, PA-C 03/20/20 2103    Little, Ambrose Finland, MD 03/21/20 1652

## 2020-04-05 NOTE — Progress Notes (Signed)
[facility]    Provider:  Dr Lucia Gaskins Requesting Provider: Ellis Savage, NP Primary Care Provider:  Patient, [name]  CC:  Migraines  HPI:  Jennifer Howard is a 54 y.o. female here as requested by Ellis Savage, NP for migraines. Started in 1995. Started in the setting of stress. She was scared, thought she had a brain tumor, she had an EEG. She was diagnosed with migraines by a physician. The weather is a trigger. Stress is a huge trigger. Always on thee left side of the head, ice pick through the eye, nausea and vomiting, no aura, acute in onset, light and sound sensitivity, smells and light are the worst. Light can trigger a migraine. Smells can trigger or make it worse.Ice cold helps. She can have migraines every day. She snores, her sinuses are clogged. She can't breath, her head hurts, this past year has been the worst. They last all day. No other focal neurologic deficits, associated symptoms, inciting events or modifiable factors.  Tried: Cymbalta, Topamax, lamictal, went to the headache center at [facility]  Review of Systems: Patient complains of symptoms per HPI as well as the following symptoms: migraines. Pertinent negatives and positives per HPI. All others negative.   Social History   Socioeconomic History  . Marital status: Single    Spouse name: Not on file  . Number of children: 0  . Years of education: Not on file  . Highest education level: Not on file  Occupational History  . Occupation: Environmental health practitioner  Tobacco Use  . Smoking status: Never Smoker  . Smokeless tobacco: Never Used  Substance and Sexual Activity  . Alcohol use: No  . Drug use: No  . Sexual activity: Never    Birth control/protection: None  Other Topics Concern  . Not on file  Social History Narrative   Lives with her mother and takes care of her   Right handed   Caffeine: 1 cup/day   Social Determinants of Health   Financial Resource Strain:   . Difficulty of Paying  Living Expenses: Not on file  Food Insecurity:   . Worried About Programme researcher, broadcasting/film/video in the Last Year: Not on file  . Ran Out of Food in the Last Year: Not on file  Transportation Needs:   . Lack of Transportation (Medical): Not on file  . Lack of Transportation (Non-Medical): Not on file  Physical Activity:   . Days of Exercise per Week: Not on file  . Minutes of Exercise per Session: Not on file  Stress:   . Feeling of Stress : Not on file  Social Connections:   . Frequency of Communication with Friends and Family: Not on file  . Frequency of Social Gatherings with Friends and Family: Not on file  . Attends Religious Services: Not on file  . Active Member of Clubs or Organizations: Not on file  . Attends Banker Meetings: Not on file  . Marital Status: Not on file  Intimate Partner Violence:   . Fear of Current or Ex-Partner: Not on file  . Emotionally Abused: Not on file  . Physically Abused: Not on file  . Sexually Abused: Not on file    Family History  Problem Relation Age of Onset  . Diabetes Mother   . Cancer Mother        bladder  . Heart attack Father   . Heart disease Father   . Urolithiasis Father   . Colon cancer Neg Hx   .  Colon polyps Neg Hx   . Migraines Neg Hx     Past Medical History:  Diagnosis Date  . Arthritis    "little; in my back, right foot" (04/12/2016)  . Chronic lower back pain   . Depression   . GERD (gastroesophageal reflux disease)   . HTN (hypertension)   . Migraine    "twice/week" (04/12/2016)  . Obesity   . SVT (supraventricular tachycardia) (HCC)    a. s/p RFCA on 04/12/16    Patient Active Problem List   Diagnosis Date Noted  . Chronic migraine without aura, with intractable migraine, so stated, with status migrainosus 04/06/2020  . SVT (supraventricular tachycardia) (HCC) 04/12/2016  . HTN (hypertension)   . Morbid obesity (HCC) 10/14/2014    Past Surgical History:  Procedure Laterality Date  .  ELECTROPHYSIOLOGIC STUDY N/A 04/12/2016   Procedure: SVT Ablation;  Surgeon: Marinus Maw, MD;  Location: [facility] INVASIVE CV LAB;  Service: Cardiovascular;  Laterality: N/A;  . HEMANGIOMA EXCISION  1972 - 1990   "right breast"  . REDUCTION MAMMAPLASTY Left 1990   "to make it the same size as the right"  . TOE SURGERY Right ~ 1985   "removal of tumor in toe"    Current Outpatient Medications  Medication Sig Dispense Refill  . ARIPiprazole (ABILIFY) 2 MG tablet Take 2 mg by mouth daily.    . chlorpheniramine (CHLOR-TRIMETON) 4 MG tablet Take 4 mg by mouth daily as needed for allergies.    . citalopram (CELEXA) 40 MG tablet Take 80 mg by mouth at bedtime.    . Collagen-Boron-Hyaluronic Acid (MOVE FREE ULTRA JOINT HEALTH PO) Take by mouth daily.    Marland Kitchen esomeprazole (NEXIUM) 20 MG capsule Take 20 mg by mouth daily as needed.    Marland Kitchen ibuprofen (ADVIL,MOTRIN) 200 MG tablet Take 600 mg by mouth every 6 (six) hours as needed for moderate pain. Patient used this medication for pain.     Marland Kitchen lamoTRIgine (LAMICTAL) 200 MG tablet Take 400 mg by mouth daily.    Marland Kitchen lidocaine (LIDODERM) 5 % Place 1 patch onto the skin daily. Remove & Discard patch within 12 hours or as directed by MD    . metoprolol tartrate (LOPRESSOR) 25 MG tablet Take one tablet as needed for heart racing.  May take an additional tablet after 30 minutes if needed. 60 tablet 11  . topiramate (TOPAMAX) 25 MG capsule Take 50 mg by mouth at bedtime.    . trolamine salicylate (ASPERCREME) 10 % cream Apply 1 application topically as needed for muscle pain. Patient used this medication for foot and shoulder pain.     Marland Kitchen zolmitriptan (ZOMIG) 5 MG tablet Take 0.5-1 tablets (2.5-5 mg total) by mouth every 2 (two) hours as needed for migraine. Max twice in one day 9 tablet 6  . Galcanezumab-gnlm (EMGALITY) 120 MG/ML SOAJ Inject 120 mg into the skin every 30 (thirty) days. 4 mL 0  . Ubrogepant (UBRELVY) 100 MG TABS Take 100 mg by mouth every 2 (two) hours as  needed. Maximum 200mg  a day. 20 tablet 0   No current facility-administered medications for this visit.    Allergies as of 04/06/2020 - Review Complete 04/06/2020  Allergen Reaction Noted  . Prednisone Other (See Comments) 11/14/2015  . Iodine Nausea And Vomiting 08/29/2013  . Penicillins Other (See Comments) 08/28/2011    Vitals: BP 133/80 (BP Location: Right Arm, Patient Position: Sitting, Cuff Size: Large)   Pulse 73   Ht 5\' 4"  (1.626 m)  Wt 295 lb (133.8 kg)   LMP  (LMP Unknown)   BMI 50.64 kg/m  Last Weight:  Wt Readings from Last 1 Encounters:  04/06/20 295 lb (133.8 kg)   Last Height:   Ht Readings from Last 1 Encounters:  04/06/20 5\' 4"  (1.626 m)     Physical exam: Exam: Gen: NAD, conversant, well nourised, obese, well groomed                     CV: RRR, no MRG. No Carotid Bruits. No peripheral edema, warm, nontender Eyes: Conjunctivae clear without exudates or hemorrhage  Neuro: Detailed Neurologic Exam  Speech:    Speech is normal; fluent and spontaneous with normal comprehension.  Cognition:    The patient is oriented to person, place, and time;     recent and remote memory intact;     language fluent;     normal attention, concentration,     fund of knowledge Cranial Nerves:    The pupils are equal, round, and reactive to light. Could not visualize fundi well but appear flat. Visual fields are full to finger confrontation. Extraocular movements are intact. Trigeminal sensation is intact The face is symmetric. The palate elevates in the midline. Hearing intact. Voice is normal. Shoulder shrug is normal. The tongue has normal motion without fasciculations.   Coordination:    No dysmetria or ataxia  Gait:  normal native gait  Motor Observation:    No asymmetry, no atrophy, and no involuntary movements noted. Tone:    Normal muscle tone.    Posture:    Posture is normal. normal erect    Strength:    Strength is V/V in the upper and lower  limbs.      Sensation: intact to LT     Reflex Exam:  DTR's:    Deep tendon reflexes in the upper and lower extremities are normal bilaterally.   Toes:    The toes are downgoing bilaterally.   Clonus:    Clonus is absent.    Assessment/Plan:  54 year old with chronic migraines daily. She is self pay so will try and treat with any samples I have in clinic today until we help her with [facility].   - likely needs a sleep study, blood work and an MRI brain but is self pay, will try to help with [facility] financial assistance program. She is to email me after she hears back so we can go from there. - Start Emgality,gave samples should last 5 months - Continue Zomig - Ubrelvy for bridging to help until the medications starts working, gave samples - email me in 4-6 weeks and let me know how you are doing.    Meds ordered this encounter  Medications  . Ubrogepant (UBRELVY) 100 MG TABS    Sig: Take 100 mg by mouth every 2 (two) hours as needed. Maximum 200mg  a day.    Dispense:  20 tablet    Refill:  0  . zolmitriptan (ZOMIG) 5 MG tablet    Sig: Take 0.5-1 tablets (2.5-5 mg total) by mouth every 2 (two) hours as needed for migraine. Max twice in one day    Dispense:  9 tablet    Refill:  6  . Galcanezumab-gnlm (EMGALITY) 120 MG/ML SOAJ    Sig: Inject 120 mg into the skin every 30 (thirty) days.    Dispense:  4 mL    Refill:  0   To prevent or relieve headaches, try  the following: Cool Compress. Lie down and place a cool compress on your head.  Avoid headache triggers. If certain foods or odors seem to have triggered your migraines in the past, avoid them. A headache diary might help you identify triggers.  Include physical activity in your daily routine. Try a daily walk or other moderate aerobic exercise.  Manage stress. Find healthy ways to cope with the stressors, such as delegating tasks on your to-do list.  Practice relaxation techniques. Try deep breathing, yoga,  massage and visualization.  Eat regularly. Eating regularly scheduled meals and maintaining a healthy diet might help prevent headaches. Also, drink plenty of fluids.  Follow a regular sleep schedule. Sleep deprivation might contribute to headaches Consider biofeedback. With this mind-body technique, you learn to control certain bodily functions -- such as muscle tension, heart rate and blood pressure -- to prevent headaches or reduce headache pain.    Proceed to emergency room if you experience new or worsening symptoms or symptoms do not resolve, if you have new neurologic symptoms or if headache is severe, or for any concerning symptom.   Provided education and documentation from American headache Society toolbox including articles on: chronic migraine medication overuse headache, chronic migraines, prevention of migraines, behavioral and other nonpharmacologic treatments for headache.  Cc: Ellis Savage, NP,  Patient, [name]  Naomie Dean, MD   [facility] [address] [place], [place] [address]  Phone [phone] Fax [phone]  I spent 40 minutes of face-to-face and non-face-to-face time with patient on the  1. Chronic migraine without aura, with intractable migraine, so stated, with status migrainosus    diagnosis.  This included previsit chart review, lab review, study review, order entry, electronic health record documentation, patient education on the different diagnostic and therapeutic options, counseling and coordination of care, risks and benefits of management, compliance, or risk factor reduction

## 2020-04-06 ENCOUNTER — Other Ambulatory Visit: Payer: Self-pay

## 2020-04-06 ENCOUNTER — Encounter: Payer: Self-pay | Admitting: Neurology

## 2020-04-06 ENCOUNTER — Encounter: Payer: Self-pay | Admitting: *Deleted

## 2020-04-06 ENCOUNTER — Ambulatory Visit: Payer: Self-pay | Admitting: Neurology

## 2020-04-06 VITALS — BP 133/80 | HR 73 | Ht 64.0 in | Wt 295.0 lb

## 2020-04-06 DIAGNOSIS — G43711 Chronic migraine without aura, intractable, with status migrainosus: Secondary | ICD-10-CM

## 2020-04-06 MED ORDER — ZOLMITRIPTAN 5 MG PO TABS: 2.5000 mg | ORAL_TABLET | ORAL | 6 refills | Status: DC | PRN

## 2020-04-06 MED ORDER — EMGALITY 120 MG/ML ~~LOC~~ SOAJ: 120.0000 mg | SUBCUTANEOUS | 0 refills | Status: DC

## 2020-04-06 MED ORDER — UBRELVY 100 MG PO TABS: 100.0000 mg | ORAL_TABLET | ORAL | 0 refills | Status: DC | PRN

## 2020-04-06 NOTE — Progress Notes (Signed)
Abstracted chart with notes from [facility].

## 2020-04-06 NOTE — Patient Instructions (Signed)
- likely needs a sleep study and an MRI brain but is self pay, will try to help with [facility] financial assistance. - Start Emgality,gave samples should last 5 months - Continue Zomig - Ubrelvy for bridging to help until the medications starts working  Ubrogepant tablets What is this medicine? UBROGEPANT (ue BROE je pant) is used to treat migraine headaches with or without aura. An aura is a strange feeling or visual disturbance that warns you of an attack. It is not used to prevent migraines. This medicine may be used for other purposes; ask your health care provider or pharmacist if you have questions. COMMON BRAND NAME(S): Bernita Raisin What should I tell my health care provider before I take this medicine? They need to know if you have any of these conditions:  kidney disease  liver disease  an unusual or allergic reaction to ubrogepant, other medicines, foods, dyes, or preservatives  pregnant or trying to get pregnant  breast-feeding How should I use this medicine? Take this medicine by mouth with a glass of water. Follow the directions on the prescription label. You can take it with or without food. If it upsets your stomach, take it with food. Take your medicine at regular intervals. Do not take it more often than directed. Do not stop taking except on your doctor's advice. Talk to your pediatrician about the use of this medicine in children. Special care may be needed. Overdosage: If you think you have taken too much of this medicine contact a poison control center or emergency room at once. NOTE: This medicine is only for you. Do not share this medicine with others. What if I miss a dose? This does not apply. This medicine is not for regular use. What may interact with this medicine? Do not take this medicine with any of the following medicines:  ceritinib  certain antibiotics like chloramphenicol, clarithromycin, telithromycin  certain antivirals for HIV like atazanavir,  cobicistat, darunavir, delavirdine, fosamprenavir, indinavir, ritonavir  certain medicines for fungal infections like itraconazole, ketoconazole, posaconazole, voriconazole  conivaptan  grapefruit  idelalisib  mifepristone  nefazodone  ribociclib This medicine may also interact with the following medications:  carvedilol  certain medicines for seizures like phenobarbital, phenytoin  ciprofloxacin  cyclosporine  eltrombopag  fluconazole  fluvoxamine  quinidine  rifampin  St. John's wort  verapamil This list may not describe all possible interactions. Give your health care provider a list of all the medicines, herbs, non-prescription drugs, or dietary supplements you use. Also tell them if you smoke, drink alcohol, or use illegal drugs. Some items may interact with your medicine. What should I watch for while using this medicine? Visit your health care professional for regular checks on your progress. Tell your health care professional if your symptoms do not start to get better or if they get worse. Your mouth may get dry. Chewing sugarless gum or sucking hard candy and drinking plenty of water may help. Contact your health care professional if the problem does not go away or is severe. What side effects may I notice from receiving this medicine? Side effects that you should report to your doctor or health care professional as soon as possible:  allergic reactions like skin rash, itching or hives; swelling of the face, lips, or tongue Side effects that usually do not require medical attention (report these to your doctor or health care professional if they continue or are bothersome):  drowsiness  dry mouth  nausea  tiredness This list may not describe all  possible side effects. Call your doctor for medical advice about side effects. You may report side effects to FDA at 1-800-FDA-1088. Where should I keep my medicine? Keep out of the reach of children. Store  at room temperature between 15 and 30 degrees C (59 and 86 degrees F). Throw away any unused medicine after the expiration date. NOTE: This sheet is a summary. It may not cover all possible information. If you have questions about this medicine, talk to your doctor, pharmacist, or health care provider.  2020 Elsevier/Gold Standard (2018-08-27 08:50:55) Galcanezumab injection What is this medicine? GALCANEZUMAB (gal ka NEZ ue mab) is used to prevent migraines and treat cluster headaches. This medicine may be used for other purposes; ask your health care provider or pharmacist if you have questions. COMMON BRAND NAME(S): Emgality What should I tell my health care provider before I take this medicine? They need to know if you have any of these conditions:  an unusual or allergic reaction to galcanezumab, other medicines, foods, dyes, or preservatives  pregnant or trying to get pregnant  breast-feeding How should I use this medicine? This medicine is for injection under the skin. You will be taught how to prepare and give this medicine. Use exactly as directed. Take your medicine at regular intervals. Do not take your medicine more often than directed. It is important that you put your used needles and syringes in a special sharps container. Do not put them in a trash can. If you do not have a sharps container, call your pharmacist or healthcare provider to get one. Talk to your pediatrician regarding the use of this medicine in children. Special care may be needed. Overdosage: If you think you have taken too much of this medicine contact a poison control center or emergency room at once. NOTE: This medicine is only for you. Do not share this medicine with others. What if I miss a dose? If you miss a dose, take it as soon as you can. If it is almost time for your next dose, take only that dose. Do not take double or extra doses. What may interact with this medicine? Interactions are not  expected. This list may not describe all possible interactions. Give your health care provider a list of all the medicines, herbs, non-prescription drugs, or dietary supplements you use. Also tell them if you smoke, drink alcohol, or use illegal drugs. Some items may interact with your medicine. What should I watch for while using this medicine? Tell your doctor or healthcare professional if your symptoms do not start to get better or if they get worse. What side effects may I notice from receiving this medicine? Side effects that you should report to your doctor or health care professional as soon as possible:  allergic reactions like skin rash, itching or hives, swelling of the face, lips, or tongue Side effects that usually do not require medical attention (report these to your doctor or health care professional if they continue or are bothersome):  pain, redness, or irritation at site where injected This list may not describe all possible side effects. Call your doctor for medical advice about side effects. You may report side effects to FDA at 1-800-FDA-1088. Where should I keep my medicine? Keep out of the reach of children. You will be instructed on how to store this medicine. Throw away any unused medicine after the expiration date on the label. NOTE: This sheet is a summary. It may not cover all possible information. If you  have questions about this medicine, talk to your doctor, pharmacist, or health care provider.  2020 Elsevier/Gold Standard (2017-11-26 12:03:23)

## 2020-07-08 IMAGING — CT CT MAXILLOFACIAL W/O CM
4 of 9 series · 14 of 47 positions shown, 16 images · non-contrast
Comparison: None.

CLINICAL DATA: Facial trauma

EXAM:
CT MAXILLOFACIAL WITHOUT CONTRAST
TECHNIQUE: Multidetector CT imaging of the maxillofacial structures was
performed. Multiplanar CT image reconstructions were also generated.

[Series 3: head wo · axial · 0.46mm/px · z∈[-148,-94]mm · 2 of 34 slices shown]
[im 12/34  bone]
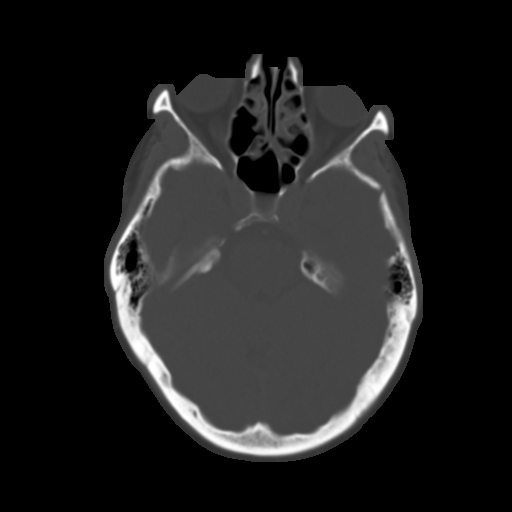
[im 23/34  bone]
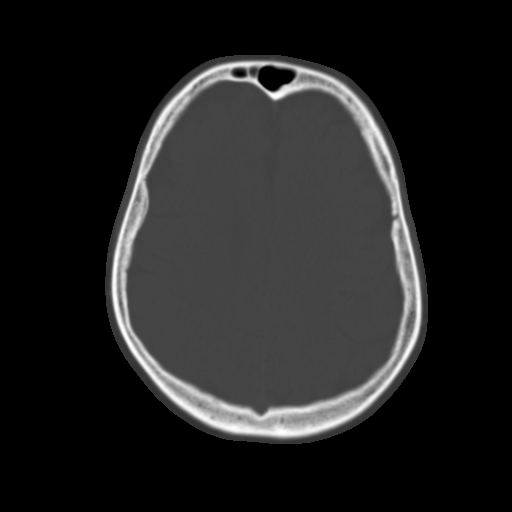

[Series 5: head coronal · coronal · 0.33mm/px · 2 of 71 slices shown]
[im 24/71  bone]
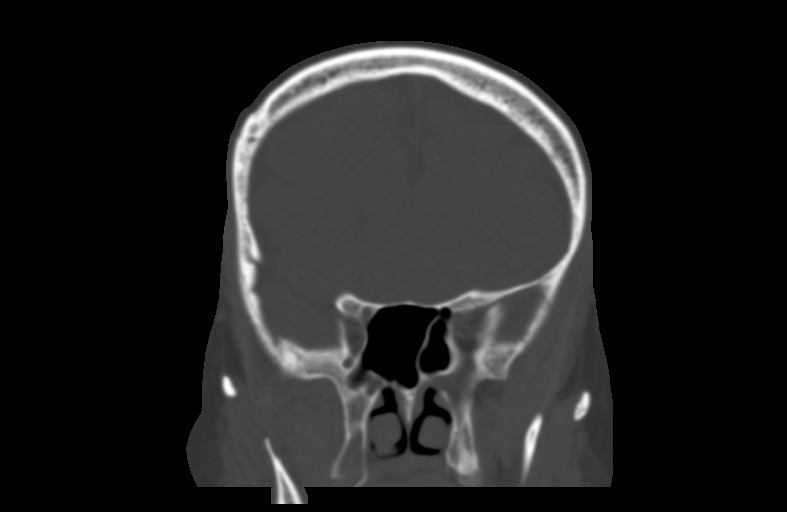
[im 47/71  bone]
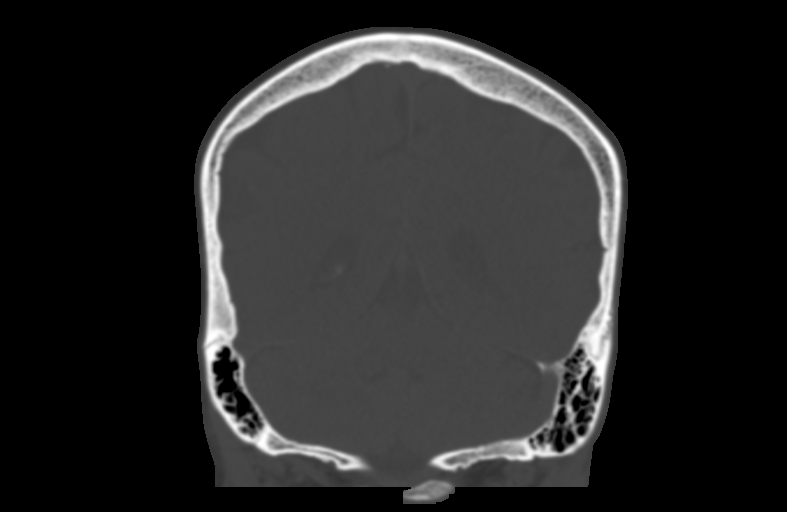

[Series 6: head sagittal · sagittal · 0.33mm/px · 2 of 57 slices shown]
[im 19/57  bone]
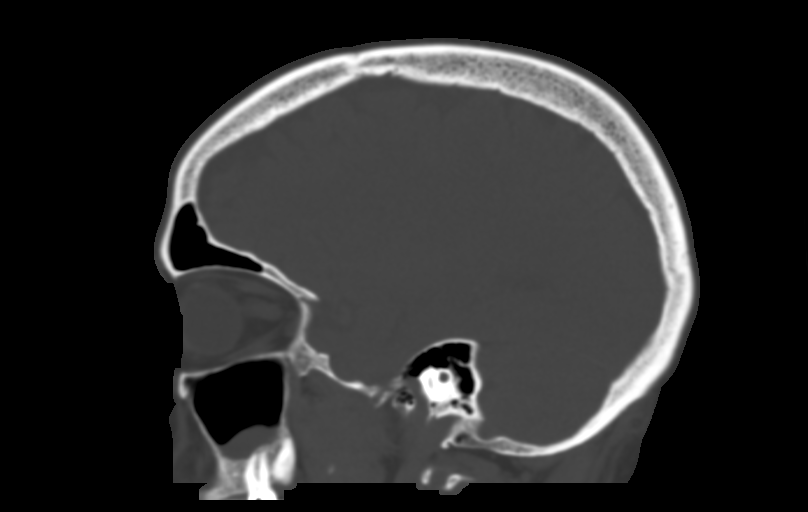
[im 38/57  bone]
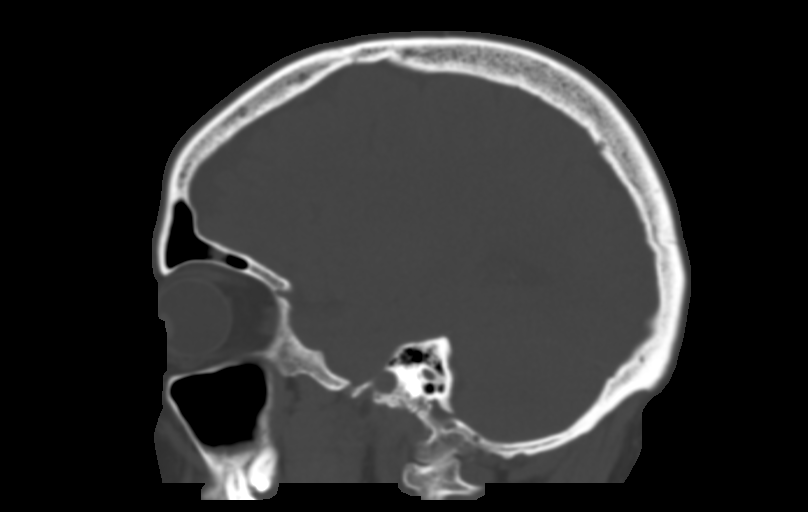

[Series 19: orthogonal axials · axial · 0.33mm/px · z∈[-349,-201]mm · 8 of 103 slices shown, 10 images]
[im 12/103  brain]
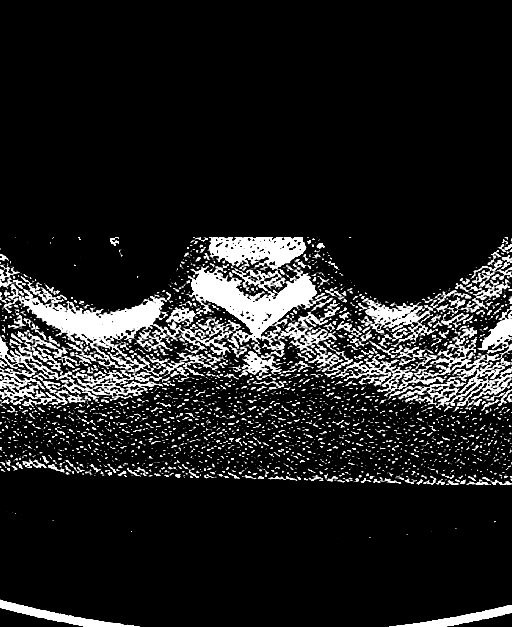
[im 12/103  bone]
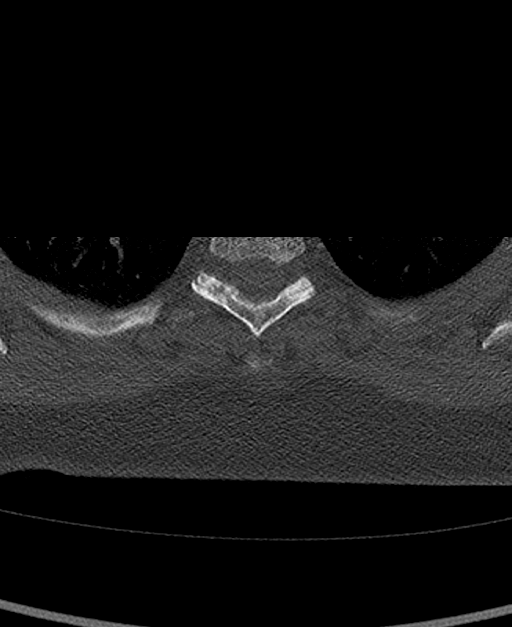
[im 23/103  bone]
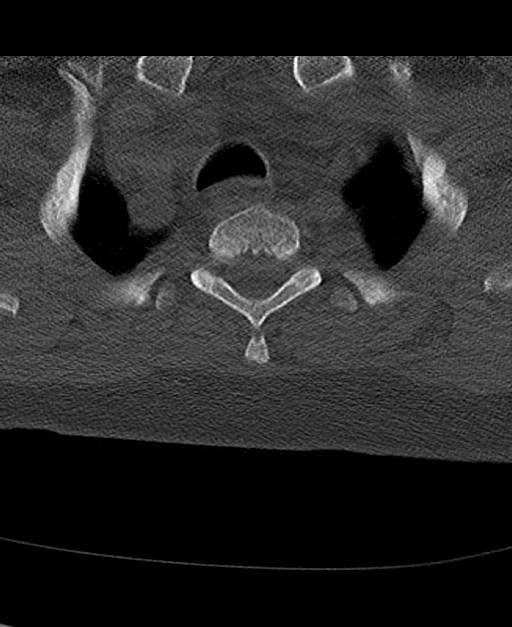
[im 35/103  bone]
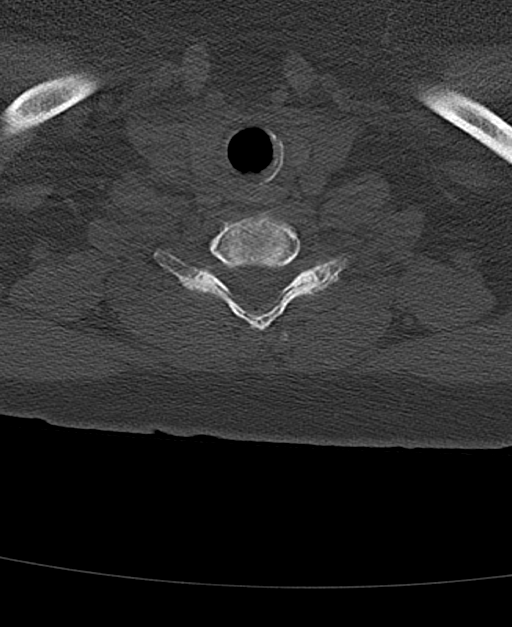
[im 46/103  bone]
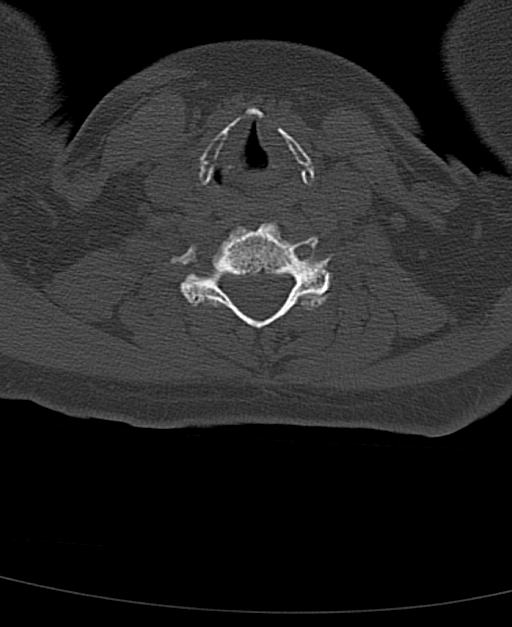
[im 57/103  brain]
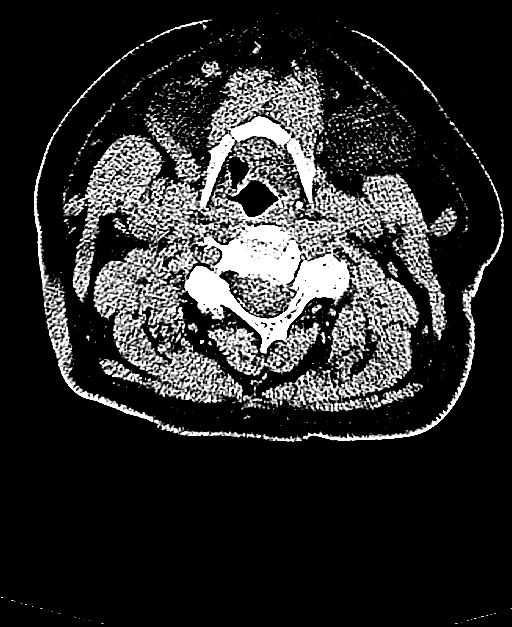
[im 57/103  bone]
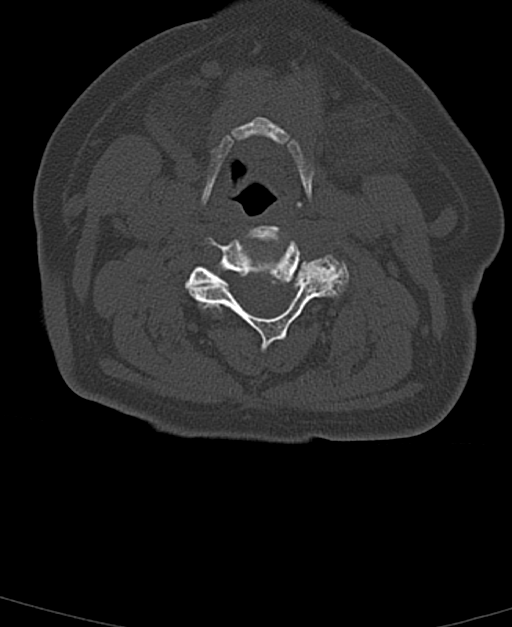
[im 69/103  bone]
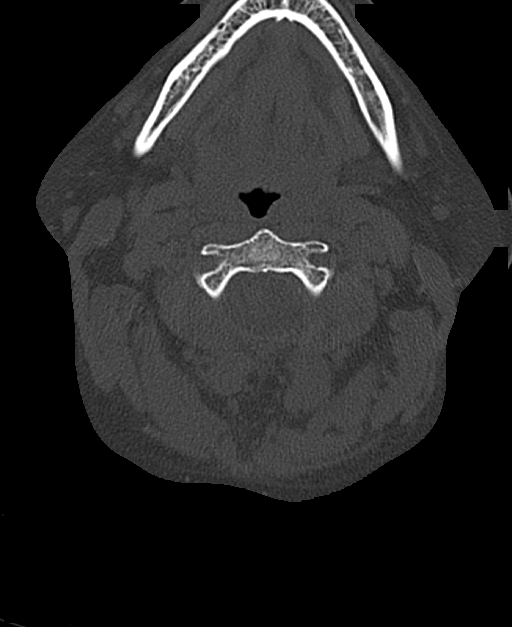
[im 80/103  bone]
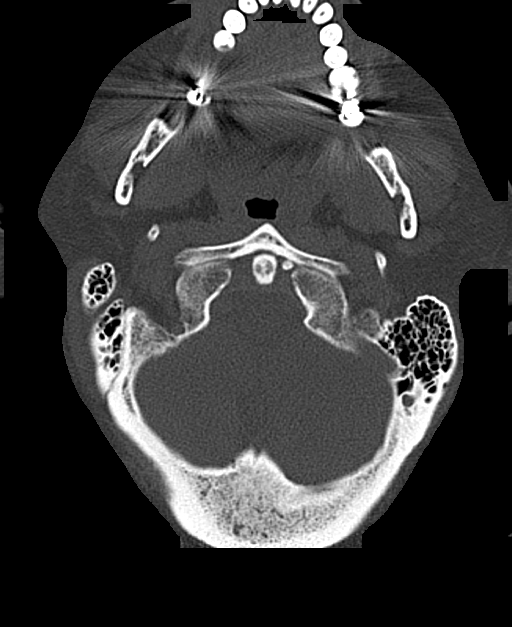
[im 91/103  bone]
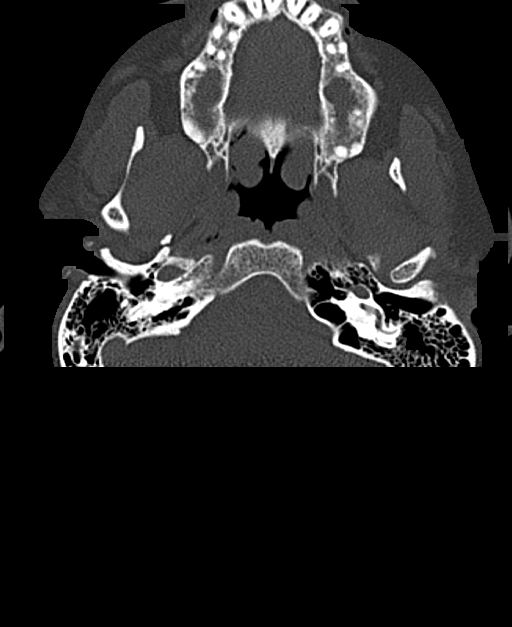

[14 of 47 positions shown; findings below may reference images not displayed]

FINDINGS: Osseous: No fracture or mandibular dislocation. No destructive
process.

Orbits: Negative. No traumatic or inflammatory finding.

Sinuses: Mucosal thickening throughout the paranasal sinuses. No
air-fluid levels.

Soft tissues: Negative

Limited intracranial: No acute findings
IMPRESSION: No evidence of facial or orbital fracture.

Chronic sinusitis.

## 2020-08-16 ENCOUNTER — Telehealth: Payer: Self-pay | Admitting: Neurology

## 2020-08-16 NOTE — Telephone Encounter (Signed)
Spoke with Dr Lucia Gaskins, ok to give patient Emgality sample box of 2 pens while financial assistance is pending. I spoke with pt. She cannot come tomorrow but will try to be here Monday to pick it up. She verbalized appreciation for the call.

## 2020-08-16 NOTE — Telephone Encounter (Signed)
Spoke with patient. She will go to the [employer] website and look into applying for patient assistance. If she meets requirements she will get the completed application to our office so we can fill out provider portion and fax to [employer]. Pt also asked for a sample to buy her time while applying. She will also apply for [facility] financial assistance via the financial dept.

## 2020-08-16 NOTE — Telephone Encounter (Signed)
Pt. states she needs Emgality migraine shots & what does she need to do to get the paperwork filled out being that she doesn't have any insurance. Please advise.

## 2020-08-21 MED ORDER — EMGALITY 120 MG/ML ~~LOC~~ SOAJ: 120.0000 mg | SUBCUTANEOUS | 0 refills | Status: DC

## 2020-08-21 NOTE — Telephone Encounter (Signed)
Emgality sample order placed. Patient came by office today to pick it up.

## 2020-08-21 NOTE — Addendum Note (Signed)
Addended by: Bertram Savin on: 08/21/2020 11:41 AM   Modules accepted: Orders

## 2020-10-18 IMAGING — DX DG CHEST 1V PORT
1 series · 1 of 1 positions shown · non-contrast
Comparison: Radiograph 08/29/2013

CLINICAL DATA: SVT, tachycardia

EXAM:
PORTABLE CHEST 1 VIEW

[chest ap]
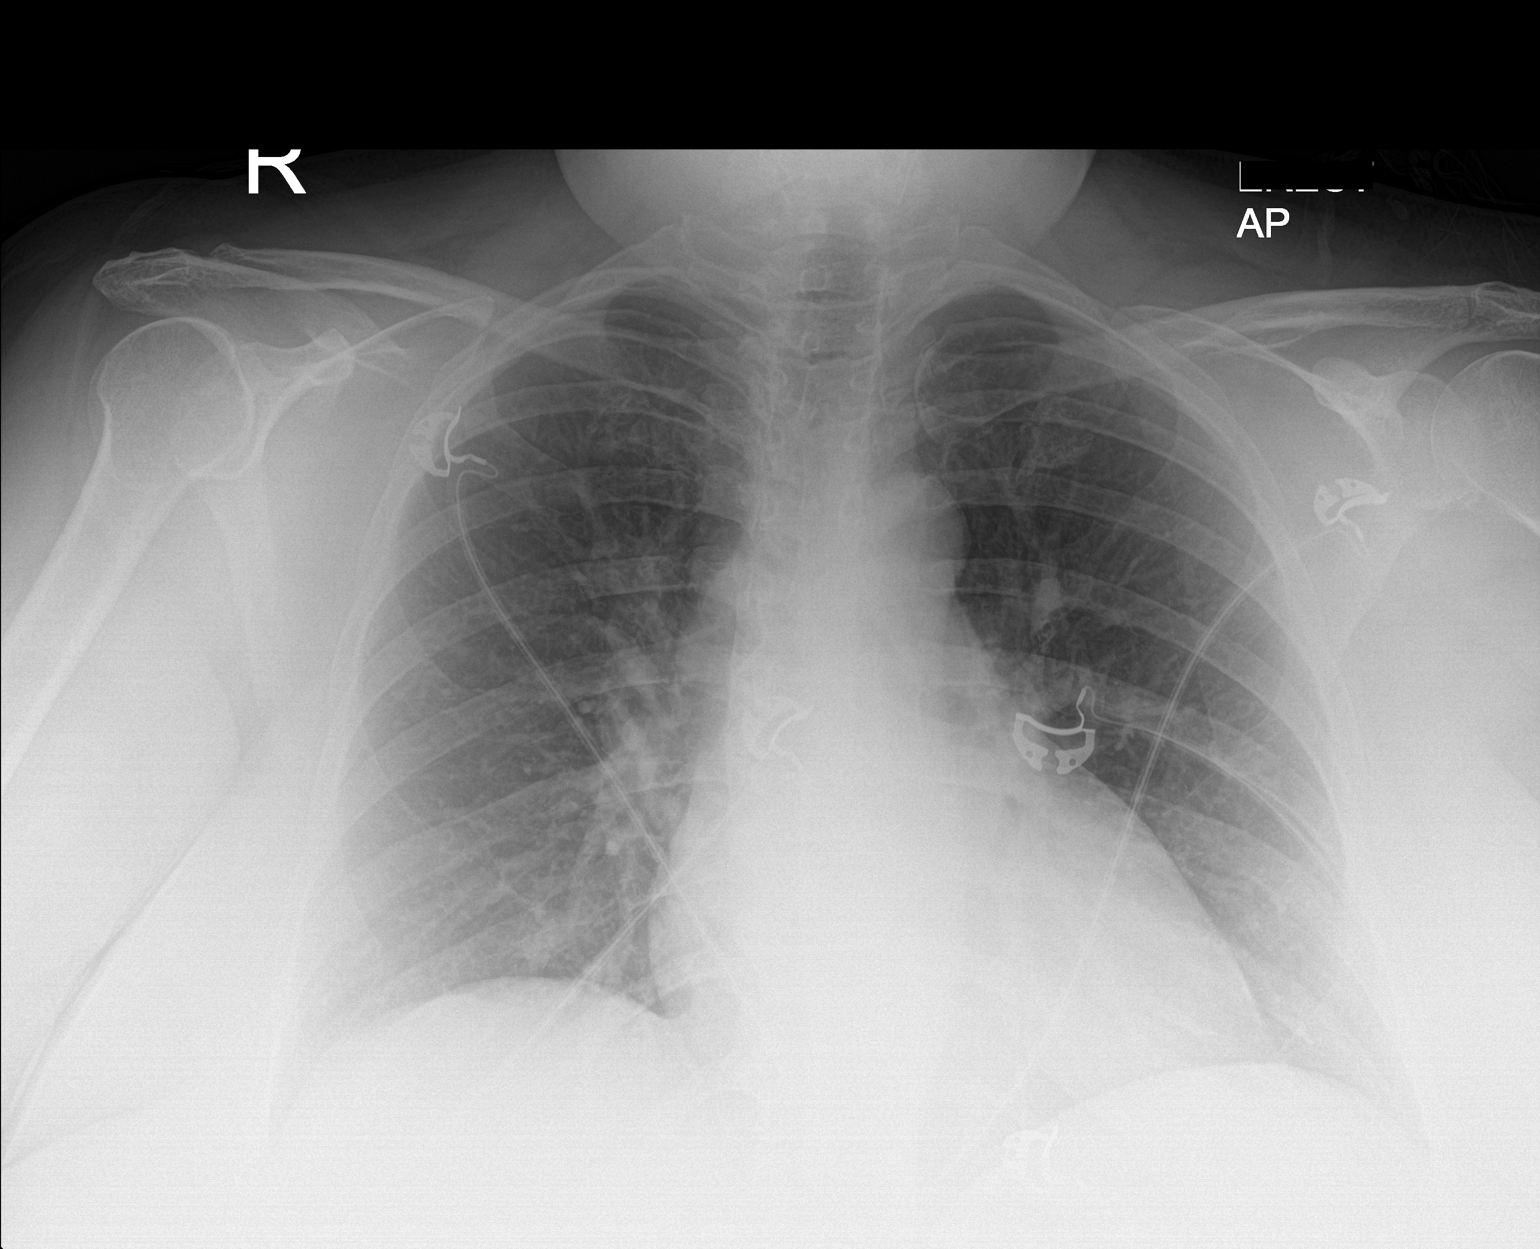

[1 of 1 positions shown; findings below may reference images not displayed]

FINDINGS: Low lung volumes with central vascular congestion. No consolidation,
features of edema, pneumothorax, or effusion. Prominence the cardiac
silhouette likely accentuated by the portable technique and low
volumes. The cardiomediastinal contours are unremarkable. No acute
osseous or soft tissue abnormality. Telemetry leads overlie the
chest.
IMPRESSION: Low lung volumes with central vascular congestion. No frank edema or
effusion.

## 2021-04-11 ENCOUNTER — Other Ambulatory Visit: Payer: Self-pay | Admitting: Neurology

## 2022-03-20 ENCOUNTER — Ambulatory Visit: Payer: Self-pay | Admitting: Neurology

## 2022-04-04 ENCOUNTER — Encounter: Payer: Self-pay | Admitting: Neurology

## 2022-04-04 ENCOUNTER — Ambulatory Visit: Payer: 59 | Admitting: Neurology

## 2022-04-04 VITALS — BP 122/78 | HR 73 | Ht 64.0 in | Wt 301.2 lb

## 2022-04-04 DIAGNOSIS — G43709 Chronic migraine without aura, not intractable, without status migrainosus: Secondary | ICD-10-CM

## 2022-04-04 MED ORDER — EMGALITY 120 MG/ML ~~LOC~~ SOAJ: 120.0000 mg | SUBCUTANEOUS | 11 refills | Status: DC

## 2022-04-04 MED ORDER — ZOLMITRIPTAN 5 MG PO TABS: ORAL_TABLET | ORAL | 11 refills | Status: DC

## 2022-04-04 NOTE — Progress Notes (Signed)
[facility]    Provider:  Dr Lucia Gaskins Requesting Provider: No ref. provider found Primary Care Provider:  Patient, [name]  CC:  Migraines  Having 15 migraine days a month or more and daily headaches. When she was on the emgality and it was really helping. She has morning headaches and we discussed sleep apnea, she has bad knees, she has gained a lot of weight, we can try emgality again and see how she does. She takes zomig and it works. We ordered emgality, filled out the copay card.   HPI 04/06/2020:  Jennifer Howard is a 56 y.o. female here as requested by No ref. provider found for migraines. Started in 1995. Started in the setting of stress. She was scared, thought she had a brain tumor, she had an EEG. She was diagnosed with migraines by a physician. The weather is a trigger. Stress is a huge trigger. Always on thee left side of the head, ice pick through the eye, nausea and vomiting, no aura, acute in onset, light and sound sensitivity, smells and light are the worst. Light can trigger a migraine. Smells can trigger or make it worse.Ice cold helps. She can have migraines every day. She snores, her sinuses are clogged. She can't breath, her head hurts, this past year has been the worst. They last all day. No other focal neurologic deficits, associated symptoms, inciting events or modifiable factors.  Tried: Cymbalta, Topamax, lamictal, went to the headache center at [facility], amitriptyline, metoprolol, reglan, , tylenol, asa, celexa, emgality, ibuprofen, toradol, zofran, ubrelvy, verapamil, zomig, sumatriptan  Patient complains of symptoms per HPI as well as the following symptoms: migraines . Pertinent negatives and positives per HPI. All others negative    Social History   Socioeconomic History   Marital status: Single    Spouse name: Not on file   Number of children: 0   Years of education: Not on file   Highest education level: Not on file  Occupational History    Occupation: Environmental health practitioner  Tobacco Use   Smoking status: Never   Smokeless tobacco: Never  Substance and Sexual Activity   Alcohol use: No   Drug use: No   Sexual activity: Never    Birth control/protection: None  Other Topics Concern   Not on file  Social History Narrative   Lives with her mother and takes care of her   Right handed   Caffeine: 1 cup/day   Social Determinants of Corporate investment banker Strain: Not on file  Food Insecurity: Not on file  Transportation Needs: Not on file  Physical Activity: Not on file  Stress: Not on file  Social Connections: Not on file  Intimate Partner Violence: Not on file    Family History  Problem Relation Age of Onset   Diabetes Mother    Cancer Mother        bladder   Heart attack Father    Heart disease Father    Urolithiasis Father    Colon cancer Neg Hx    Colon polyps Neg Hx    Migraines Neg Hx     Past Medical History:  Diagnosis Date   Arthritis    "little; in my back, right foot" (04/12/2016)   Chronic lower back pain    Depression    GERD (gastroesophageal reflux disease)    HTN (hypertension)    Migraine    "twice/week" (04/12/2016)   Obesity    SVT (supraventricular tachycardia)    a.  s/p RFCA on 04/12/16    Patient Active Problem List   Diagnosis Date Noted   Chronic migraine without aura without status migrainosus, not intractable 04/04/2022   Chronic migraine without aura, with intractable migraine, so stated, with status migrainosus 04/06/2020   SVT (supraventricular tachycardia) 04/12/2016   HTN (hypertension)    Morbid obesity (HCC) 10/14/2014    Past Surgical History:  Procedure Laterality Date   ELECTROPHYSIOLOGIC STUDY N/A 04/12/2016   Procedure: SVT Ablation;  Surgeon: Marinus Maw, MD;  Location: [facility] INVASIVE CV LAB;  Service: Cardiovascular;  Laterality: N/A;   HEMANGIOMA EXCISION  1972 - 1990   "right breast"   REDUCTION MAMMAPLASTY Left 1990   "to make it the same  size as the right"   TOE SURGERY Right ~ 1985   "removal of tumor in toe"    Current Outpatient Medications  Medication Sig Dispense Refill   buPROPion (WELLBUTRIN) 100 MG tablet Take 100 mg by mouth. Take 2 tablets in am     chlorpheniramine (CHLOR-TRIMETON) 4 MG tablet Take 4 mg by mouth daily as needed for allergies.     citalopram (CELEXA) 40 MG tablet Take 80 mg by mouth at bedtime.     esomeprazole (NEXIUM) 20 MG capsule Take 20 mg by mouth daily as needed.     Galcanezumab-gnlm (EMGALITY) 120 MG/ML SOAJ Inject 120 mg into the skin every 30 (thirty) days. 1.12 mL 11   ibuprofen (ADVIL,MOTRIN) 200 MG tablet Take 600 mg by mouth every 6 (six) hours as needed for moderate pain. Patient used this medication for pain.      lamoTRIgine (LAMICTAL) 200 MG tablet Take 400 mg by mouth daily.     lidocaine (LIDODERM) 5 % Place 1 patch onto the skin daily. Remove & Discard patch within 12 hours or as directed by MD     metoprolol tartrate (LOPRESSOR) 25 MG tablet Take one tablet as needed for heart racing.  May take an additional tablet after 30 minutes if needed. 60 tablet 11   trolamine salicylate (ASPERCREME) 10 % cream Apply 1 application topically as needed for muscle pain. Patient used this medication for foot and shoulder pain.      zolmitriptan (ZOMIG) 5 MG tablet TAKE 1/2 TO ONE TABLET BY MOUTH AT ONSET OF HEADACHE; MAY REPEAT ONE TABLET IN 2 HOURS IF NEEDED. 9 tablet 11   No current facility-administered medications for this visit.    Allergies as of 04/04/2022 - Review Complete 04/04/2022  Allergen Reaction Noted   Prednisone Other (See Comments) 11/14/2015   Iodine Nausea And Vomiting 08/29/2013   Penicillins Other (See Comments) 08/28/2011    Vitals: BP 122/78   Pulse 73   Ht 5\' 4"  (1.626 m)   Wt (!) 301 lb 3.2 oz (136.6 kg)   LMP  (LMP Unknown)   BMI 51.70 kg/m  Last Weight:  Wt Readings from Last 1 Encounters:  04/04/22 (!) 301 lb 3.2 oz (136.6 kg)   Last Height:    Ht Readings from Last 1 Encounters:  04/04/22 5\' 4"  (1.626 m)   Exam: NAD, pleasant                  Speech:    Speech is normal; fluent and spontaneous with normal comprehension.  Cognition:    The patient is oriented to person, place, and time;     recent and remote memory intact;     language fluent;    Cranial Nerves:    The  pupils are equal, round, and reactive to light.Trigeminal sensation is intact and the muscles of mastication are normal. The face is symmetric. The palate elevates in the midline. Hearing intact. Voice is normal. Shoulder shrug is normal. The tongue has normal motion without fasciculations.   Coordination:  No dysmetria  Motor Observation:    No asymmetry, no atrophy, and no involuntary movements noted. Tone:    Normal muscle tone.     Strength:    Strength is V/V in the upper and lower limbs.      Sensation: intact to LT     Assessment/Plan:  56 year old with chronic migraines daily. Emgality was great in the past, will prescribe. Will see how it helps, may consider a sleep study if needed and if no improvement may consider brain imaging but no red flags at this time but low threshold. Discussed in detail, options.   - In the past discussed likely needs a sleep study, blood work and an MRI brain but was self pay, tried to help with [facility] financial assistance program. Now she has insurance will monitor and prescribe emgality, she wants to hold off on mri and sleep study - Start Emgality,prescribed, helped with copay card - Continue Zomig - follow up or my chart me   Meds ordered this encounter  Medications   Galcanezumab-gnlm (EMGALITY) 120 MG/ML SOAJ    Sig: Inject 120 mg into the skin every 30 (thirty) days.    Dispense:  1.12 mL    Refill:  11    Copay card: [id number] pcn [id number] group [id number] ID [id number]   zolmitriptan (ZOMIG) 5 MG tablet    Sig: TAKE 1/2 TO ONE TABLET BY MOUTH AT ONSET OF HEADACHE; MAY REPEAT ONE TABLET IN 2 HOURS IF NEEDED.     Dispense:  9 tablet    Refill:  11   To prevent or relieve headaches, try the following: Cool Compress. Lie down and place a cool compress on your head.  Avoid headache triggers. If certain foods or odors seem to have triggered your migraines in the past, avoid them. A headache diary might help you identify triggers.  Include physical activity in your daily routine. Try a daily walk or other moderate aerobic exercise.  Manage stress. Find healthy ways to cope with the stressors, such as delegating tasks on your to-do list.  Practice relaxation techniques. Try deep breathing, yoga, massage and visualization.  Eat regularly. Eating regularly scheduled meals and maintaining a healthy diet might help prevent headaches. Also, drink plenty of fluids.  Follow a regular sleep schedule. Sleep deprivation might contribute to headaches Consider biofeedback. With this mind-body technique, you learn to control certain bodily functions -- such as muscle tension, heart rate and blood pressure -- to prevent headaches or reduce headache pain.    Proceed to emergency room if you experience new or worsening symptoms or symptoms do not resolve, if you have new neurologic symptoms or if headache is severe, or for any concerning symptom.   Provided education and documentation from American headache Society toolbox including articles on: chronic migraine medication overuse headache, chronic migraines, prevention of migraines, behavioral and other nonpharmacologic treatments for headache.  Cc: No ref. provider found,  Patient, [name]  Sarina Ill, MD  [facility] [address] [place], [place] [address]  Phone [phone] Fax [phone]  I spent 30 minutes of face-to-face and non-face-to-face time with patient on the  1. Chronic migraine without aura without status  migrainosus, not intractable    diagnosis.  This included previsit chart review, lab review, study  review, order entry, electronic health record documentation, patient education on the different diagnostic and therapeutic options, counseling and coordination of care, risks and benefits of management, compliance, or risk factor reduction

## 2022-04-04 NOTE — Patient Instructions (Signed)
Start emgality Zomig as needed  Galcanezumab Injection What is this medication? GALCANEZUMAB (gal ka NEZ ue mab) prevents migraines. It works by blocking a substance in the body that causes migraines. It may also be used to treat cluster headaches. It is a monoclonal antibody. This medicine may be used for other purposes; ask your health care provider or pharmacist if you have questions. COMMON BRAND NAME(S): Emgality What should I tell my care team before I take this medication? They need to know if you have any of these conditions: An unusual or allergic reaction to galcanezumab, other medications, foods, dyes, or preservatives Pregnant or trying to get pregnant Breast-feeding How should I use this medication? This medication is injected under the skin. You will be taught how to prepare and give it. Take it as directed on the prescription label. Keep taking it unless your care team tells you to stop. It is important that you put your used needles and syringes in a special sharps container. Do not put them in a trash can. If you do not have a sharps container, call your pharmacist or care team to get one. Talk to your care team about the use of this medication in children. Special care may be needed. Overdosage: If you think you have taken too much of this medicine contact a poison control center or emergency room at once. NOTE: This medicine is only for you. Do not share this medicine with others. What if I miss a dose? If you miss a dose, take it as soon as you can. If it is almost time for your next dose, take only that dose. Do not take double or extra doses. What may interact with this medication? Interactions are not expected. This list may not describe all possible interactions. Give your health care provider a list of all the medicines, herbs, non-prescription drugs, or dietary supplements you use. Also tell them if you smoke, drink alcohol, or use illegal drugs. Some items may  interact with your medicine. What should I watch for while using this medication? Visit your care team for regular checks on your progress. Tell your care team if your symptoms do not start to get better or if they get worse. What side effects may I notice from receiving this medication? Side effects that you should report to your care team as soon as possible: Allergic reactions or angioedema--skin rash, itching or hives, swelling of the face, eyes, lips, tongue, arms, or legs, trouble swallowing or breathing Side effects that usually do not require medical attention (report to your care team if they continue or are bothersome): Pain, redness, or irritation at injection site This list may not describe all possible side effects. Call your doctor for medical advice about side effects. You may report side effects to FDA at 1-800-FDA-1088. Where should I keep my medication? Keep out of the reach of children and pets. Store in a refrigerator or at room temperature between 20 and 25 degrees C (68 and 77 degrees F). Refrigeration (preferred): Store in the refrigerator. Do not freeze. Keep in the original container until you are ready to take it. Remove the dose from the carton about 30 minutes before it is time for you to use it. If the dose is not used, it may be stored in original container at room temperature for 7 days. Get rid of any unused medication after the expiration date. Room Temperature: This medication may be stored at room temperature for up to 7 days. Keep it  in the original container. Protect from light until time of use. If it is stored at room temperature, get rid of any unused medication after 7 days or after it expires, whichever is first. To get rid of medications that are no longer needed or have expired: Take the medication to a medication take-back program. Check with your pharmacy or law enforcement to find a location. If you cannot return the medication, ask your pharmacist or  care team how to get rid of this medication safely. NOTE: This sheet is a summary. It may not cover all possible information. If you have questions about this medicine, talk to your doctor, pharmacist, or health care provider.  2023 Elsevier/Gold Standard (2021-07-31 00:00:00)

## 2022-07-03 ENCOUNTER — Telehealth: Payer: Self-pay | Admitting: Neurology

## 2022-07-03 ENCOUNTER — Encounter: Payer: Self-pay | Admitting: Neurology

## 2022-07-03 NOTE — Telephone Encounter (Signed)
Pt is asking if the request for a PA for Emgality was received from pharmacy, Pt states this is urgent, please call.

## 2022-07-03 NOTE — Telephone Encounter (Signed)
Jennifer Howard (Key: [id number]) Emgality 120MG /ML auto-injectors (migraine  PA Emgality complete waiting on approval

## 2022-07-03 NOTE — Telephone Encounter (Signed)
Spoke to patient . Pt states insurance is the same will work on [employer] this afternoon

## 2022-07-04 NOTE — Telephone Encounter (Signed)
Pt has called back to report she has been informed that she does not meet the standard for getting migraines as to why she has been denied, she would like a call to discuss.

## 2022-07-04 NOTE — Telephone Encounter (Signed)
Jennifer Howard (Key: [id number]) Emgality 120MG /ML auto-injectors (migraine)  Message from Plan Request Reference Number: [id number]. EMGALITY INJ 120MG /ML is approved through 01/02/2023. Your patient may now fill this prescription and it will be covered.   Resent PA Emgality  Approved  will make patient aware

## 2022-08-12 ENCOUNTER — Encounter: Payer: Self-pay | Admitting: Neurology

## 2022-10-07 ENCOUNTER — Telehealth: Payer: Self-pay | Admitting: Neurology

## 2022-10-07 ENCOUNTER — Encounter: Payer: Self-pay | Admitting: Neurology

## 2022-10-07 ENCOUNTER — Telehealth: Payer: 59 | Admitting: Neurology

## 2022-10-07 DIAGNOSIS — G4719 Other hypersomnia: Secondary | ICD-10-CM | POA: Diagnosis not present

## 2022-10-07 DIAGNOSIS — G43709 Chronic migraine without aura, not intractable, without status migrainosus: Secondary | ICD-10-CM | POA: Diagnosis not present

## 2022-10-07 DIAGNOSIS — R0683 Snoring: Secondary | ICD-10-CM | POA: Diagnosis not present

## 2022-10-07 DIAGNOSIS — R519 Headache, unspecified: Secondary | ICD-10-CM

## 2022-10-07 MED ORDER — EMGALITY 120 MG/ML ~~LOC~~ SOAJ: 120.0000 mg | SUBCUTANEOUS | 11 refills | Status: DC

## 2022-10-07 NOTE — Patient Instructions (Addendum)
   Continue emgality, zomig Sleep evaluation

## 2022-10-07 NOTE — Telephone Encounter (Signed)
Have her follow up next march with megan for med refill she is doing well onmigraines can be video

## 2022-10-07 NOTE — Progress Notes (Signed)
[facility]    Provider:  Dr Lucia Gaskins Requesting Provider: No ref. provider found Primary Care Provider:  Patient, [name]  Virtual Visit via Video Note  I connected with Jennifer Howard on 10/07/22 at  3:30 PM EDT by a video enabled telemedicine application and verified that I am speaking with the correct person using two identifiers.  Location: Patient: home Provider: office   I discussed the limitations of evaluation and management by telemedicine and the availability of in person appointments. The patient expressed understanding and agreed to proceed.  Follow Up Instructions:    I discussed the assessment and treatment plan with the patient. The patient was provided an opportunity to ask questions and all were answered. The patient agreed with the plan and demonstrated an understanding of the instructions.   The patient was advised to call back or seek an in-person evaluation if the symptoms worsen or if the condition fails to improve as anticipated.  I provided over 20 minutes of non-face-to-face time during this encounter.   Anson Fret, MD  CC:  Migraines  10/07/2022: Here for follow up. Started on emgality for chronic migraines. Doing great. Sleep study: waking with headaches, snoring, excessively tired, morning headache, obese, could fall asleep against a wall, wakes herslef up snoring.  Patient complains of symptoms per HPI as well as the following symptoms: none . Pertinent negatives and positives per HPI. All others negative   04/04/2022: Having 15 migraine days a month or more and daily headaches. When she was on the emgality and it was really helping. She has morning headaches and we discussed sleep apnea, she has bad knees, she has gained a lot of weight, we can try emgality again and see how she does. She takes zomig and it works. We ordered emgality, filled out the copay card.    HPI 04/06/2020:  Jennifer Howard is a 57 y.o. female here as  requested by No ref. provider found for migraines. Started in 1995. Started in the setting of stress. She was scared, thought she had a brain tumor, she had an EEG. She was diagnosed with migraines by a physician. The weather is a trigger. Stress is a huge trigger. Always on thee left side of the head, ice pick through the eye, nausea and vomiting, no aura, acute in onset, light and sound sensitivity, smells and light are the worst. Light can trigger a migraine. Smells can trigger or make it worse.Ice cold helps. She can have migraines every day. She snores, her sinuses are clogged. She can't breath, her head hurts, this past year has been the worst. They last all day. No other focal neurologic deficits, associated symptoms, inciting events or modifiable factors.  Tried: Cymbalta, Topamax, lamictal, went to the headache center at [facility], amitriptyline, metoprolol, reglan, , tylenol, asa, celexa, emgality, ibuprofen, toradol, zofran, ubrelvy, verapamil, zomig, sumatriptan  Patient complains of symptoms per HPI as well as the following symptoms: migraines . Pertinent negatives and positives per HPI. All others negative    Social History   Socioeconomic History   Marital status: Single    Spouse name: Not on file   Number of children: 0   Years of education: Not on file   Highest education level: Not on file  Occupational History   Occupation: Environmental health practitioner  Tobacco Use   Smoking status: Never   Smokeless tobacco: Never  Substance and Sexual Activity   Alcohol use: No   Drug use: No   Sexual activity:  Never    Birth control/protection: None  Other Topics Concern   Not on file  Social History Narrative   Lives with her mother and takes care of her   Right handed   Caffeine: 1 cup/day   Social Determinants of Health   Financial Resource Strain: Not on file  Food Insecurity: Not on file  Transportation Needs: Not on file  Physical Activity: Not on file  Stress: Not on file   Social Connections: Not on file  Intimate Partner Violence: Not on file    Family History  Problem Relation Age of Onset   Diabetes Mother    Cancer Mother        bladder   Heart attack Father    Heart disease Father    Urolithiasis Father    Colon cancer Neg Hx    Colon polyps Neg Hx    Migraines Neg Hx     Past Medical History:  Diagnosis Date   Arthritis    "little; in my back, right foot" (04/12/2016)   Chronic lower back pain    Depression    GERD (gastroesophageal reflux disease)    HTN (hypertension)    Migraine    "twice/week" (04/12/2016)   Obesity    SVT (supraventricular tachycardia)    a. s/p RFCA on 04/12/16    Patient Active Problem List   Diagnosis Date Noted   Chronic migraine without aura without status migrainosus, not intractable 04/04/2022   Chronic migraine without aura, with intractable migraine, so stated, with status migrainosus 04/06/2020   SVT (supraventricular tachycardia) 04/12/2016   HTN (hypertension)    Morbid obesity (HCC) 10/14/2014    Past Surgical History:  Procedure Laterality Date   ELECTROPHYSIOLOGIC STUDY N/A 04/12/2016   Procedure: SVT Ablation;  Surgeon: Marinus Maw, MD;  Location: [facility] INVASIVE CV LAB;  Service: Cardiovascular;  Laterality: N/A;   HEMANGIOMA EXCISION  1972 - 1990   "right breast"   REDUCTION MAMMAPLASTY Left 1990   "to make it the same size as the right"   TOE SURGERY Right ~ 1985   "removal of tumor in toe"    Current Outpatient Medications  Medication Sig Dispense Refill   buPROPion (WELLBUTRIN) 100 MG tablet Take 100 mg by mouth. Take 2 tablets in am     chlorpheniramine (CHLOR-TRIMETON) 4 MG tablet Take 4 mg by mouth daily as needed for allergies.     citalopram (CELEXA) 40 MG tablet Take 80 mg by mouth at bedtime.     esomeprazole (NEXIUM) 20 MG capsule Take 20 mg by mouth daily as needed.     Galcanezumab-gnlm (EMGALITY) 120 MG/ML SOAJ Inject 120 mg into the skin every 30 (thirty) days.  Please give 90 day suppy if possible. NEW COPAY CARD: Rxbin [id number] PCN PDMI grp [id number] ID [id number] expires 05/2023 3 mL 11   ibuprofen (ADVIL,MOTRIN) 200 MG tablet Take 600 mg by mouth every 6 (six) hours as needed for moderate pain. Patient used this medication for pain.      lamoTRIgine (LAMICTAL) 200 MG tablet Take 400 mg by mouth daily.     lidocaine (LIDODERM) 5 % Place 1 patch onto the skin daily. Remove & Discard patch within 12 hours or as directed by MD     metoprolol tartrate (LOPRESSOR) 25 MG tablet Take one tablet as needed for heart racing.  May take an additional tablet after 30 minutes if needed. 60 tablet 11   trolamine salicylate (ASPERCREME) 10 % cream Apply 1 application  topically as needed for muscle pain. Patient used this medication for foot and shoulder pain.      zolmitriptan (ZOMIG) 5 MG tablet TAKE 1/2 TO ONE TABLET BY MOUTH AT ONSET OF HEADACHE; MAY REPEAT ONE TABLET IN 2 HOURS IF NEEDED. 9 tablet 11   No current facility-administered medications for this visit.    Allergies as of 10/07/2022 - Review Complete 10/07/2022  Allergen Reaction Noted   Prednisone Other (See Comments) 11/14/2015   Iodine Nausea And Vomiting 08/29/2013   Penicillins Other (See Comments) 08/28/2011    Vitals: LMP  (LMP Unknown)  Last Weight:  Wt Readings from Last 1 Encounters:  04/04/22 (!) 301 lb 3.2 oz (136.6 kg)   Last Height:   Ht Readings from Last 1 Encounters:  04/04/22  (1.626 m)   Physical exam: Exam: Gen: NAD, conversant      CV: . Denies palpitations or chest pain or SOB. VS: Breathing at a normal rate. Not febrile. Eyes: Conjunctivae clear without exudates or hemorrhage  Neuro: Detailed Neurologic Exam  Speech:    Speech is normal; fluent and spontaneous with normal comprehension.  Cognition:    The patient is oriented to person, place, and time;     recent and remote memory intact;     language fluent;     normal attention, concentration,     fund  of knowledge Cranial Nerves:    The pupils are equal, round, and reactive to light. visual fields are full . Extraocular movements are intact.  The face is symmetric with normal sensation. The palate elevates in the midline. Hearing intact. Voice is normal. Shoulder shrug is normal. The tongue has normal motion without fasciculations.   Coordination:    No abnormality seen  Motor Observation:   no involuntary movements noted. Tone:    Appears normal  Posture:    Posture is normal. normal erect    Strength:    Strength is anti-gravity and symmetric in the upper and lower limbs.      Sensation: intact to LT       Assessment/Plan:  57 year old with chronic migraines daily. Still having 4 migraines a month what she wakes up with it, it is a morning headache,  which is a great improvement from daily but the rest of the headaches are always in the morning and may be sleep apnea. Filled out a new emgality card for her and put it in the prescription. Sent team phone message to call for follow up.   - Sleep study: waking with headaches, snoring, excessively tired, morning headache, obese, could fall asleep against a wall, wakes herslef up snoring. - Emgality works great, helped with copay card, keep her on it - Continue Zomig: she loves her zomig  Meds ordered this encounter  Medications   Galcanezumab-gnlm (EMGALITY) 120 MG/ML SOAJ    Sig: Inject 120 mg into the skin every 30 (thirty) days. Please give 90 day suppy if possible. NEW COPAY CARD: Rxbin [id number] PCN PDMI grp [id number] ID [id number] expires 05/2023    Dispense:  3 mL    Refill:  11    Please give 90 day suppy if possible. NEW COPAY CARD: Rxbin [id number] PCN PDMI grp [id number] ID [id number] expires 05/2023   Orders Placed This Encounter  Procedures   Ambulatory referral to Sleep Studies    To prevent or relieve headaches, try the following: Cool Compress. Lie down and place a cool compress on your head.  Avoid headache  triggers. If certain foods or odors seem to have triggered your migraines in the past, avoid them. A headache diary might help you identify triggers.  Include physical activity in your daily routine. Try a daily walk or other moderate aerobic exercise.  Manage stress. Find healthy ways to cope with the stressors, such as delegating tasks on your to-do list.  Practice relaxation techniques. Try deep breathing, yoga, massage and visualization.  Eat regularly. Eating regularly scheduled meals and maintaining a healthy diet might help prevent headaches. Also, drink plenty of fluids.  Follow a regular sleep schedule. Sleep deprivation might contribute to headaches Consider biofeedback. With this mind-body technique, you learn to control certain bodily functions -- such as muscle tension, heart rate and blood pressure -- to prevent headaches or reduce headache pain.    Proceed to emergency room if you experience new or worsening symptoms or symptoms do not resolve, if you have new neurologic symptoms or if headache is severe, or for any concerning symptom.   Provided education and documentation from American headache Society toolbox including articles on: chronic migraine medication overuse headache, chronic migraines, prevention of migraines, behavioral and other nonpharmacologic treatments for headache.  Cc: No ref. provider found,  Patient, [name]  Naomie Dean, MD  [facility] [address] [place], [place] [address]  Phone [phone] Fax [phone]

## 2022-11-06 ENCOUNTER — Encounter: Payer: Self-pay | Admitting: Neurology

## 2022-11-06 ENCOUNTER — Ambulatory Visit: Payer: 59 | Admitting: Neurology

## 2022-11-06 VITALS — BP 150/93 | HR 80 | Ht 64.0 in | Wt 255.8 lb

## 2022-11-06 DIAGNOSIS — I471 Supraventricular tachycardia, unspecified: Secondary | ICD-10-CM | POA: Diagnosis not present

## 2022-11-06 DIAGNOSIS — R519 Headache, unspecified: Secondary | ICD-10-CM | POA: Insufficient documentation

## 2022-11-06 DIAGNOSIS — R0683 Snoring: Secondary | ICD-10-CM | POA: Diagnosis not present

## 2022-11-06 DIAGNOSIS — G4719 Other hypersomnia: Secondary | ICD-10-CM | POA: Insufficient documentation

## 2022-11-06 DIAGNOSIS — G44011 Episodic cluster headache, intractable: Secondary | ICD-10-CM | POA: Diagnosis not present

## 2022-11-06 NOTE — Patient Instructions (Addendum)
ASSESSMENT AND PLAN ASSESSMENT AND PLAN:  This  57 - year old female patient of Dr Trevor Mace  was seen here with:   Two forms of sleep related headaches in the setting of chronic migraines without aura.     1) Morning headaches , these are dull, throbbing, and respond to Tylenol. Almost daily.   2) headaches waking her out of sleep- these are left temple and parietal scalp  located, throbbing and sometimes "piercing  the eye " . Can last 24 hours.   3) high degrees of  fatigue and sleepiness endorsed, snoring herself awake.    Also endorsed : Sleep choking, frequent nocturia /vivid dreams.    Jennifer Howard has multiple risk factors for the presence of obstructive sleep apnea including a high-grade Mallampati, and elevated body mass index and neck circumference.  She has been aware of her snoring as she has woken up from it. It is well possible that her high degree of fatigue and sleepiness as previously endorsed are related to sleep disordered breathing for which she will be screened by a home sleep test.  This sleep-related headache for which she was referred also can relate to hypoxia and sleep.  Especially cluster headaches are often a manifestation of hypoxia patient's with a background history of migraine.Alric Quan Sleep: This home sleep test will be performed with a WatchPAT device, the patient's insurance will be asked to approve the test and also advises and we will follow-up in case of a positive sleep apnea test within 3 months of therapy for apnea.  If the test should be negative, I would be very tempted to repeated as an in lab sleep study given her firm report of clinical symptoms.   I plan to follow up either personally or through our NP within 4-5 months.   After spending a total time of  40  minutes face to face and additional time for physical and neurologic examination, review of laboratory studies,  personal review of imaging studies, reports and results of other  testing and review of referral information / records as far as provided in visit,   Electronically signed by: Melvyn Novas, MD 11/06/2022 1:08 PM   Screening for Sleep Apnea  Sleep apnea is a condition in which breathing pauses or becomes shallow during sleep. Sleep apnea screening is a test to determine if you are at risk for sleep apnea. The test includes a series of questions. It will only takes a few minutes. Your health care provider may ask you to have this test in preparation for surgery or as part of a physical exam. What are the symptoms of sleep apnea? Common symptoms of sleep apnea include: Snoring. Waking up often at night. Daytime sleepiness. Pauses in breathing. Choking or gasping during sleep. Irritability. Forgetfulness. Trouble thinking clearly. Depression. Personality changes. Most people with sleep apnea do not know that they have it. What are the advantages of sleep apnea screening? Getting screened for sleep apnea can help: Ensure your safety. It is important for your health care providers to know whether or not you have sleep apnea, especially if you are having surgery or have other long-term (chronic) health conditions. Improve your health and allow you to get a better night's rest. Restful sleep can help you: Have more energy. Lose weight. Improve high blood pressure. Improve diabetes management. Prevent stroke. Prevent car accidents. What happens during the screening? Screening usually includes being asked a list of questions about your sleep quality. Some questions  you may be asked include: Do you snore? Is your sleep restless? Do you have daytime sleepiness? Has a partner or spouse told you that you stop breathing during sleep? Have you had trouble concentrating or memory loss? What is your age? What is your neck circumference? To measure your neck, keep your back straight and gently wrap the tape measure around your neck. Put the tape measure at  the middle of your neck, between your chin and collarbone. What is your sex assigned at birth? Do you have or are you being treated for high blood pressure? If your screening test is positive, you are at risk for the condition. Further testing may be needed to confirm a diagnosis of sleep apnea. Where to find more information You can find screening tools online or at your health care clinic. For more information about sleep apnea screening and healthy sleep, visit these websites: Centers for Disease Control and Prevention: FootballExhibition.com.br American Sleep Apnea Association: www.sleepapnea.org Contact a health care provider if: You think that you may have sleep apnea. Summary Sleep apnea screening can help determine if you are at risk for sleep apnea. It is important for your health care providers to know whether or not you have sleep apnea, especially if you are having surgery or have other chronic health conditions. You may be asked to take a screening test for sleep apnea in preparation for surgery or as part of a physical exam. This information is not intended to replace advice given to you by your health care provider. Make sure you discuss any questions you have with your health care provider. Document Revised: 05/19/2020 Document Reviewed: 05/19/2020 Elsevier Patient Education  2023 Elsevier Inc.   Quality Sleep Information, Adult Quality sleep is important for your mental and physical health. It also improves your quality of life. Quality sleep means you: Are asleep for most of the time you are in bed. Fall asleep within 30 minutes. Wake up no more than once a night. Are awake for no longer than 20 minutes if you do wake up during the night. Most adults need 7-8 hours of quality sleep each night. How can poor sleep affect me? If you do not get enough quality sleep, you may have: Mood swings. Daytime sleepiness. Decreased alertness, reaction time, and concentration. Sleep disorders,  such as insomnia and sleep apnea. Difficulty with: Solving problems. Coping with stress. Paying attention. These issues may affect your performance and productivity at work, school, and home. Lack of sleep may also put you at higher risk for accidents, suicide, and risky behaviors. If you do not get quality sleep, you may also be at higher risk for several health problems, including: Infections. Type 2 diabetes. Heart disease. High blood pressure. Obesity. Worsening of long-term conditions, like arthritis, kidney disease, depression, Parkinson's disease, and epilepsy. What actions can I take to get more quality sleep? Sleep schedule and routine Stick to a sleep schedule. Go to sleep and wake up at about the same time each day. Do not try to sleep less on weekdays and make up for lost sleep on weekends. This does not work. Limit naps during the day to 30 minutes or less. Do not take naps in the late afternoon. Make time to relax before bed. Reading, listening to music, or taking a hot bath promotes quality sleep. Make your bedroom a place that promotes quality sleep. Keep your bedroom dark, quiet, and at a comfortable room temperature. Make sure your bed is comfortable. Avoid using electronic devices  that give off bright blue light for 30 minutes before bedtime. Your brain perceives bright blue light as sunlight. This includes television, phones, and computers. If you are lying awake in bed for longer than 20 minutes, get up and do a relaxing activity until you feel sleepy. Lifestyle     Try to get at least 30 minutes of exercise on most days. Do not exercise 2-3 hours before going to bed. Do not use any products that contain nicotine or tobacco. These products include cigarettes, chewing tobacco, and vaping devices, such as e-cigarettes. If you need help quitting, ask your health care provider. Do not drink caffeinated beverages for at least 8 hours before going to bed. Coffee, tea, and  some sodas contain caffeine. Do not drink alcohol or eat large meals close to bedtime. Try to get at least 30 minutes of sunlight every day. Morning sunlight is best. Medical concerns Work with your health care provider to treat medical conditions that may affect sleeping, such as: Nasal obstruction. Snoring. Sleep apnea and other sleep disorders. Talk to your health care provider if you think any of your prescription medicines may cause you to have difficulty falling or staying asleep. If you have sleep problems, talk with a sleep consultant. If you think you have a sleep disorder, talk with your health care provider about getting evaluated by a specialist. Where to find more information Sleep Foundation: sleepfoundation.org American Academy of Sleep Medicine: aasm.org Centers for Disease Control and Prevention (CDC): TonerPromos.no Contact a health care provider if: You have trouble getting to sleep or staying asleep. You often wake up very early in the morning and cannot get back to sleep. You have daytime sleepiness. You have daytime sleep attacks of suddenly falling asleep and sudden muscle weakness (narcolepsy). You have a tingling sensation in your legs with a strong urge to move your legs (restless legs syndrome). You stop breathing briefly during sleep (sleep apnea). You think you have a sleep disorder or are taking a medicine that is affecting your quality of sleep. Summary Most adults need 7-8 hours of quality sleep each night. Getting enough quality sleep is important for your mental and physical health. Make your bedroom a place that promotes quality sleep, and avoid things that may cause you to have poor sleep, such as alcohol, caffeine, smoking, or large meals. Talk to your health care provider if you have trouble falling asleep or staying asleep. This information is not intended to replace advice given to you by your health care provider. Make sure you discuss any questions you  have with your health care provider. Document Revised: 10/03/2021 Document Reviewed: 10/03/2021 Elsevier Patient Education  2023 [employer].

## 2022-11-06 NOTE — Progress Notes (Signed)
SLEEP MEDICINE CLINIC    Provider:  Melvyn Novas, MD  Primary Care Physician:  Patient, [name] No address on file     Referring Provider: Anson Fret, Md 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 Patient states he been having migraines and wake up with headaches. Patient states she get up a lot in the middle of the night to pee and she tried during the day.    I have the pleasure of seeing Jennifer Howard 11/06/22 a right -handed Caucasian female with a possible sleep disorder.    The patient had the first sleep study in the year 2000 or earlier- performed at [facility] - no dx was made.    Sleep relevant medical history: Daily Morning headaches, Migraines, Chronic migraine without aura without status migrainosus, not intractable , travel sick,  childhood  night terrors,  frequent Nocturia - about every 2 hours, childhood and teenage -Sleep walking, seasonal allergies, rhinitis, cat allergy. Family medical /sleep history: father CPAP with OSA, died in his 89s, CHF,  defibrillator-  no known sleep walkers. Mother died at 36- found unresponsive / sudden death.    Social history:  Patient is working as a Production designer, theatre/television/film at [employer], as a historian- and lives in a household alone.  Family status is single.  The patient currently work office hours.  Tobacco use; never .  ETOH use ; none, Caffeine intake in form of Coffee( 1 cup in AM ) Soda( /) Tea ( /) or energy drinks      Sleep habits are as follows: The patient's dinner time at various times-  PM. Irregular meal times,  The patient goes to bed at 10 Pm and 12  PM and continues to sleep for 1-2  hour intervals, wakes for many bathroom breaks, the first time at 12 AM.   The  preferred sleep position is lateral, with the support of 1 pillows. Dreams are reportedly very frequent/vivid. The patient is woken by her cat  6.30- dozing . 7.30  AM is the usual rise time.  She reports not feeling refreshed or restored in AM, with symptoms such as dry mouth, morning headache, and residual fatigue.  Naps are taken after work, or on weekends-. frequently lasting from 1-2 hours and are less refreshing than nocturnal sleep.    Review of Systems: Out of a complete 14 system review, the patient complains of only the following symptoms, and all other reviewed systems are negative.:  Fatigue, sleepiness , snoring, fragmented sleep, no routines, knee pain   Insomnia, Nocturia   Depression.    How likely are you to doze in the following situations: 0 = not likely, 1 = slight chance, 2 = moderate chance, 3 = high chance   Sitting and Reading? Watching Television? Sitting inactive in a public place (theater or meeting)? As a passenger in a car for an hour without a break? Lying down in the afternoon when circumstances permit? Sitting and talking to someone? Sitting quietly after lunch without alcohol? In a car, while stopped for a few minutes in traffic?   Total =  15/ 24 points   FSS endorsed at 53/ 63 points.   Social History   Socioeconomic History   Marital status: Single    Spouse name: Not on file   Number of children: 0   Years of education: Not on file   Highest education level: Not on file  Occupational History   Occupation: Environmental health practitioner  Tobacco Use   Smoking status: Never   Smokeless tobacco: Never  Substance and Sexual Activity   Alcohol use: No   Drug use: No   Sexual activity: Never    Birth control/protection: None  Other Topics Concern   Not on file  Social History Narrative   Lives with her mother and takes care of her   Right handed   Caffeine: 1 cup/day   Social Determinants of Health   Financial Resource Strain: Not on file   Food Insecurity: Not on file  Transportation Needs: Not on file  Physical Activity: Not on file  Stress: Not on file  Social Connections: Not on file    Family History  Problem Relation Age of Onset   Diabetes Mother    Cancer Mother        bladder   Heart attack Father    Heart disease Father    Urolithiasis Father    Colon cancer Neg Hx    Colon polyps Neg Hx    Migraines Neg Hx     Past Medical History:  Diagnosis Date   Arthritis    "little; in my back, right foot" (04/12/2016)   Chronic lower back pain    Depression    GERD (gastroesophageal reflux disease)    HTN (hypertension)    Migraine    "twice/week" (04/12/2016)   Obesity    SVT (supraventricular tachycardia)    a. s/p RFCA on 04/12/16    Past Surgical History:  Procedure Laterality Date   ELECTROPHYSIOLOGIC STUDY N/A 04/12/2016   Procedure: SVT Ablation;  Surgeon: Marinus Maw, MD;  Location: [facility];  Service: Cardiovascular;  Laterality: N/A;   HEMANGIOMA EXCISION  1972 - 1990   "right breast"   REDUCTION MAMMAPLASTY Left 1990   "to make it the same size as the right"   TOE SURGERY Right ~ 1985   "removal of tumor in toe"     Current Outpatient Medications on File Prior to Visit  Medication Sig Dispense Refill   ARIPiprazole (ABILIFY) 2 MG tablet Take 2 mg by mouth daily.     buPROPion (WELLBUTRIN) 100 MG tablet Take 100 mg by mouth. Take 2 tablets in am     chlorpheniramine (CHLOR-TRIMETON) 4 MG tablet Take 4 mg by mouth daily as needed for allergies.     citalopram (CELEXA) 40 MG tablet Take 80 mg by mouth at bedtime.     esomeprazole (NEXIUM) 20 MG capsule Take 20 mg by mouth daily as needed.     Galcanezumab-gnlm (EMGALITY) 120 MG/ML SOAJ Inject 120 mg into the skin every 30 (thirty) days. Please give 90 day suppy if possible. NEW COPAY CARD: Rxbin [id number] PCN PDMI grp [id number] ID [id number] expires 05/2023 3 mL 11   ibuprofen (ADVIL,MOTRIN) 200 MG tablet Take 600 mg by mouth  every 6 (six) hours as needed for moderate pain. Patient used this medication for pain.      lamoTRIgine (LAMICTAL) 200 MG tablet Take 400 mg by mouth daily.     lidocaine (LIDODERM) 5 % Place 1 patch onto the skin daily. Remove & Discard  patch within 12 hours or as directed by MD     metoprolol tartrate (LOPRESSOR) 25 MG tablet Take one tablet as needed for heart racing.  May take an additional tablet after 30 minutes if needed. 60 tablet 11   trolamine salicylate (ASPERCREME) 10 % cream Apply 1 application topically as needed for muscle pain. Patient used this medication for foot and shoulder pain.      zolmitriptan (ZOMIG) 5 MG tablet TAKE 1/2 TO ONE TABLET BY MOUTH AT ONSET OF HEADACHE; MAY REPEAT ONE TABLET IN 2 HOURS IF NEEDED. 9 tablet 11   No current facility-administered medications on file prior to visit.    Allergies  Allergen Reactions   Prednisone Other (See Comments)    Sensitivity, migraines    Iodine Nausea And Vomiting    Nausea and vomiting, post arteriogram    Penicillins Other (See Comments)    Childhood Reaction Has patient had a PCN reaction causing immediate rash, facial/tongue/throat swelling, SOB or lightheadedness with hypotension: Unknown Has patient had a PCN reaction causing severe rash involving mucus membranes or skin necrosis: Unknown Has patient had a PCN reaction that required hospitalization Unknown Has patient had a PCN reaction occurring within the last 10 years: No If all of the above answers are "NO", then may proceed with Cephalosporin use.      DIAGNOSTIC DATA (LABS, IMAGING, TESTING) - I reviewed patient records, labs, notes, testing and imaging myself where available.  Lab Results  Component Value Date   WBC 13.6 (H) 03/20/2020   HGB 14.9 03/20/2020   HCT 45.7 03/20/2020   MCV 92.1 03/20/2020   PLT 374 03/20/2020      Component Value Date/Time   NA 142 03/20/2020 1925   K 3.8 03/20/2020 1925   CL 105 03/20/2020 1925   CO2 26  03/20/2020 1925   GLUCOSE 123 (H) 03/20/2020 1925   BUN 14 03/20/2020 1925   CREATININE 1.03 (H) 03/20/2020 1925   CREATININE 0.84 03/26/2016 1102   CALCIUM 9.0 03/20/2020 1925   PROT 7.8 03/20/2020 1925   ALBUMIN 4.7 03/20/2020 1925   AST 13 (L) 03/20/2020 1925   ALT 12 03/20/2020 1925   ALKPHOS 70 03/20/2020 1925   BILITOT 0.3 03/20/2020 1925   GFRNONAA >60 03/20/2020 1925   GFRAA >60 03/20/2020 1925   No results found for: "CHOL", "HDL", "LDLCALC", "LDLDIRECT", "TRIG", "CHOLHDL" No results found for: "HGBA1C" No results found for: "VITAMINB12" No results found for: "TSH"  PHYSICAL EXAM:  Today's Vitals   11/06/22 1255  BP: (!) 150/93  Pulse: 80  Weight: 255 lb 12.8 oz (116 kg)  Height: 5\' 4"  (1.626 m)   Body mass index is 43.91 kg/m.   Wt Readings from Last 3 Encounters:  11/06/22 255 lb 12.8 oz (116 kg)  04/04/22 (!) 301 lb 3.2 oz (136.6 kg)  04/06/20 295 lb (133.8 kg)     Ht Readings from Last 3 Encounters:  11/06/22 5\' 4"  (1.626 m)  04/04/22 5\' 4"  (1.626 m)  04/06/20 5\' 4"  (1.626 m)      General: The patient is awake, alert and appears not in acute distress. The patient is well groomed. Head: Normocephalic, atraumatic. Neck is supple.  Mallampati 3,  neck circumference:16.5 inches . Nasal airflow congested.  Retrognathia is  seen.  Dental status: biological  Cardiovascular:  Regular rate and cardiac rhythm by pulse,  without distended neck veins. Respiratory: Lungs are clear to auscultation.  Skin:  With evidence of ankle edema Trunk: The  patient's posture is erect.   NEUROLOGIC EXAM: The patient is awake and alert, oriented to place and time.   Memory subjective described as intact.  Attention span & concentration ability appears normal.  Speech is fluent,  without dysarthria, dysphonia or aphasia.  Mood and affect are appropriate.   Cranial nerves: no loss of smell or taste reported  Pupils are equal and briskly reactive to light. Funduscopic exam  deferred. .  Extraocular movements in vertical and horizontal planes were intact and without nystagmus. No Diplopia. Visual fields by finger perimetry are intact. Hearing was intact to soft voice and finger rubbing.  Facial sensation intact to fine touch. Facial motor strength is symmetric and tongue and uvula move midline.  Neck ROM : rotation, tilt and flexion extension were normal for age and shoulder shrug was symmetrical.    Motor exam:  Symmetric bulk, tone and ROM.   Normal tone without cog- wheeling, symmetric grip strength .   Sensory:  Fine touch and vibration were  normal.  Proprioception tested in the upper extremities was normal.   Coordination: Rapid alternating movements in the fingers/hands were of normal speed.  The Finger-to-nose maneuver was intact without evidence of ataxia, dysmetria or tremor.   Gait and station: Patient could rise unassisted from a seated position, walked without assistive device.  Deep tendon reflexes: in the  upper and lower extremities are symmetric and intact.  Babinski response was deferred.    ASSESSMENT AND PLAN:  This  72 - year old female patient of Dr Trevor Mace  was seen here with:   Two forms of sleep related headaches in the setting of chronic migraines without aura.     1) Morning headaches , these are dull, throbbing, and respond to Tylenol. Almost daily.   2) headaches waking her out of sleep- these are left temple and parietal scalp  located, throbbing and sometimes "piercing  the eye " . Can last 24 hours.   3) high degrees of  fatigue and sleepiness endorsed, snoring herself awake.    Also endorsed : Sleep choking, frequent nocturia /vivid dreams.    Jennifer Howard has multiple risk factors for the presence of obstructive sleep apnea including a high-grade Mallampati, and elevated body mass index and neck circumference.  She has been aware of her snoring as she has woken up from it. It is well possible that her high  degree of fatigue and sleepiness as previously endorsed are related to sleep disordered breathing for which she will be screened by a home sleep test.  This sleep-related headache for which she was referred also can relate to hypoxia and sleep.  Especially cluster headaches are often a manifestation of hypoxia patient's with a background history of migraine.Jennifer Howard Sleep: This home sleep test will be performed with a WatchPAT device, the patient's insurance will be asked to approve the test and also advises and we will follow-up in case of a positive sleep apnea test within 3 months of therapy for apnea.  If the test should be negative, I would be very tempted to repeated as an in lab sleep study given her firm report of clinical symptoms.   I plan to follow up either personally or through our NP within 4-5 months.   I would like to thank Anson Fret, Md [address] [place],  [place] [address] for allowing me to meet with and to take care of this pleasant patient.    After spending a  total time of  40  minutes face to face and additional time for physical and neurologic examination, review of laboratory studies,  personal review of imaging studies, reports and results of other testing and review of referral information / records as far as provided in visit,   Electronically signed by: Melvyn Novas, MD 11/06/2022 1:08 PM  [facility] Board certified by The [employer] of Sleep Medicine and Diplomate of the [employer] of Sleep Medicine. Board certified In Neurology through the ABPN, Fellow of the [employer] of Neu[employer].

## 2022-11-08 ENCOUNTER — Encounter: Payer: Self-pay | Admitting: Neurology

## 2022-11-14 MED ORDER — EMGALITY 120 MG/ML ~~LOC~~ SOAJ: 1.0000 | SUBCUTANEOUS | 0 refills | Status: DC

## 2022-11-14 NOTE — Addendum Note (Signed)
Addended by: Bertram Savin on: 11/14/2022 09:49 AM   Modules accepted: Orders

## 2022-11-19 DIAGNOSIS — Z0289 Encounter for other administrative examinations: Secondary | ICD-10-CM

## 2022-11-20 ENCOUNTER — Telehealth: Payer: Self-pay | Admitting: *Deleted

## 2022-11-20 NOTE — Telephone Encounter (Signed)
Dr. Lucia Gaskins, out of office,  Dr. Marinell Blight signed today.  To medical records.

## 2022-11-20 NOTE — Telephone Encounter (Signed)
FMLA form completed for pt.  To be signed by Dr. Lucia Gaskins 11-25-2022 her return back to office.

## 2022-11-20 NOTE — Telephone Encounter (Signed)
Pt fmla form faxed and email on 11/20/2022

## 2022-11-25 NOTE — Telephone Encounter (Signed)
LMVM for pt to return call concerning FMLA  paperwork.

## 2022-11-26 ENCOUNTER — Ambulatory Visit: Payer: 59 | Admitting: Neurology

## 2022-11-26 DIAGNOSIS — G4733 Obstructive sleep apnea (adult) (pediatric): Secondary | ICD-10-CM | POA: Diagnosis not present

## 2022-11-26 DIAGNOSIS — G44011 Episodic cluster headache, intractable: Secondary | ICD-10-CM

## 2022-11-26 DIAGNOSIS — G4719 Other hypersomnia: Secondary | ICD-10-CM

## 2022-11-26 DIAGNOSIS — R519 Headache, unspecified: Secondary | ICD-10-CM

## 2022-11-26 DIAGNOSIS — I471 Supraventricular tachycardia, unspecified: Secondary | ICD-10-CM

## 2022-11-26 DIAGNOSIS — R0683 Snoring: Secondary | ICD-10-CM

## 2022-11-27 NOTE — Telephone Encounter (Signed)
LVM (ok per DPR) for patient asking for call back to discuss her FMLA concerns she has. We would like to clarify what it is her job will allow. We typically only allow 2 days off work per month but we did include an add'l 2 days she could be late. We would have to get approval from Dr Lucia Gaskins for being 2 hours late vs 1. I left our office number and hours for call back to discuss further and I acknowledged her concerns about her job.

## 2022-12-02 ENCOUNTER — Other Ambulatory Visit: Payer: Self-pay | Admitting: Neurology

## 2022-12-02 ENCOUNTER — Telehealth: Payer: Self-pay

## 2022-12-02 MED ORDER — ELETRIPTAN HYDROBROMIDE 40 MG PO TABS: 40.0000 mg | ORAL_TABLET | ORAL | 11 refills | Status: DC | PRN

## 2022-12-02 NOTE — Telephone Encounter (Signed)
Patient left a voicemail this morning at 9:32 AM reporting that she has taken Zomig for an acute migraine but it is not helping.  She would like a call back to discuss if there are any other medication she can try.

## 2022-12-02 NOTE — Telephone Encounter (Signed)
Spoke to pt .  She said she had migraine yesterday, took the zomig and did not  help her, she feels that it is not as  effective as used to be.  Asking for a different triptan.  Ubrelvy did not help.  She said that imitrex did not help.  She was no all that familiar with medications that she has taken.  Rizatriptan possiblity.  [facility] in  [place].  She will keep a headache diary for triggers.  FMLA discussed.  She understands 2 days / per month and up to one hour late 2 days per month. She needs to contact her HR dept.  I told her will discuss other triptan and let her know.

## 2022-12-02 NOTE — Telephone Encounter (Signed)
Will try relpax thanks

## 2022-12-02 NOTE — Telephone Encounter (Signed)
I called pt and let her know that relpax another triptan was called in for her.  She will let us know how that works.  She appreciated call back.  Incidentlly she was out of work today for  migraine.

## 2022-12-05 NOTE — Progress Notes (Signed)
Jennifer Howard at [facility]  Jennifer Howard 57 year old female Aug 26, 1965 Comm Pref:  Works at [employer] Howard TEST REPORT ( by Watch PAT)   STUDY DATE:  12-05-2022   ORDERING CLINICIAN: Melvyn Novas, MD  REFERRING CLINICIAN: Dr Lucia Gaskins, MD    CLINICAL INFORMATION/HISTORY: I have the pleasure of seeing Jennifer Howard 11/06/22 a right -handed Caucasian female with a possible Howard disorder.    The patient had the first Howard study in the year 2000 or earlier- performed at [facility] - no dx was made.    Howard relevant ROS /history: Daily Morning headaches, Migraines, Chronic migraine without aura without status migrainosus, not intractable , travel sick,  childhood  night terrors,  frequent Nocturia - about every 2 hours, childhood and teenage -Howard walking, seasonal allergies, rhinitis, cat allergy. Family medical /Howard history: father CPAP with OSA, died in his 63s, CHF,  defibrillator-     Epworth sleepiness score: 15/24.  FSS at 53/ 63 points.    BMI: 44 kg/m   Neck Circumference: 16.5"   FINDINGS:   Howard Summary:   Total Recording Time (hours, min): 9 h 25        Total Howard Time (hours, min):    7 h 22              Percent REM (%):   20.7 %                                     Respiratory Indices by AASM criteria:   Calculated pAHI (per hour):    7.3/h                         REM pAHI:      7/h                                           NREM pAHI:      7.4/h                        Supine AHI:  6.1/h and non-supine AHI 12.9/h (!). Worse when sleeping  on the left side.                                                  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  No prolonged hypoxia which can contribute to nocturnal headaches or AM headaches.    RECOMMENDATION: the patient can give CPAP a trial or use a dental device to reduce apnea and snoring further. This is optional, due to the  mild degree of apnea.     INTERPRETING PHYSICIAN:   Melvyn Novas, MD   Cc Dr Lucia Gaskins

## 2022-12-21 ENCOUNTER — Other Ambulatory Visit (HOSPITAL_COMMUNITY): Payer: Self-pay

## 2022-12-21 ENCOUNTER — Telehealth: Payer: Self-pay

## 2022-12-21 NOTE — Telephone Encounter (Signed)
Pharmacy Patient Advocate Encounter   Received notification from [employer] that prior authorization for Emgality 120MG /ML auto-injectors (migraine) is required/requested.   PA submitted to [facility] via CoverMyMeds Key or (Medicaid) confirmation # [id number] Status is pending

## 2022-12-23 ENCOUNTER — Other Ambulatory Visit (HOSPITAL_COMMUNITY): Payer: Self-pay

## 2022-12-23 NOTE — Telephone Encounter (Signed)
Pharmacy Patient Advocate Encounter  Prior Authorization for Emgality 120MG /ML auto-injectors (migraine) has been APPROVED by [employer] from 12/21/2022 to 12/21/2023.   PA # [id number]

## 2022-12-30 ENCOUNTER — Telehealth: Payer: Self-pay | Admitting: Neurology

## 2022-12-30 NOTE — Telephone Encounter (Signed)
Patient left a voicemail on my phone asking for the results of her HST.

## 2022-12-30 NOTE — Telephone Encounter (Signed)
At this time, Dr Vickey Huger has not yet reviewed/resulted the HST. Will advise her and once she reviews we will contact the pt.

## 2022-12-31 ENCOUNTER — Telehealth: Payer: Self-pay

## 2022-12-31 NOTE — Telephone Encounter (Signed)
I called patient to discuss. No answer, left a message asking her to call us back. Please route to POD 3. 

## 2022-12-31 NOTE — Procedures (Signed)
[facility]  [name] 57 year old female [birth year] Comm Pref:  Works at [employer] SLEEP TEST REPORT ( by Watch PAT)   STUDY DATE:  12-05-2022   ORDERING CLINICIAN: Melvyn Novas, MD  REFERRING CLINICIAN: Dr Lucia Gaskins, MD    CLINICAL INFORMATION/HISTORY: I have the pleasure of seeing Jennifer Howard 11/06/22 a right -handed Caucasian female with a possible sleep disorder.    The patient had the first sleep study in the year 2000 or earlier- performed at [facility] - no dx was made.    Sleep relevant ROS /history: Daily Morning headaches, Migraines, Chronic migraine without aura without status migrainosus, not intractable , travel sick,  childhood  night terrors,  frequent Nocturia - about every 2 hours, childhood and teenage -Sleep walking, seasonal allergies, rhinitis, cat allergy. Family medical /sleep history: father CPAP with OSA, died in his 28s, CHF,  defibrillator-     Epworth sleepiness score: 15/24.  FSS at 53/ 63 points.    BMI: 44 kg/m   Neck Circumference: 16.5"   FINDINGS:   Sleep Summary:   Total Recording Time (hours, min): 9 h 25        Total Sleep Time (hours, min):    7 h 22              Percent REM (%):   20.7 %                                     Respiratory Indices by AASM criteria:   Calculated pAHI (per hour):    7.3/h                         REM pAHI:      7/h                                           NREM pAHI:      7.4/h                        Supine AHI:  6.1/h and non-supine AHI 12.9/h (!). Worse when sleeping  on the left side.                                                  Oxygen Saturation Statistics:   O2 Saturation Range (%):     between 80  % and 99%, mean saturation at  96%                               O2 Saturation (minutes) <89%: 0.1 m           Pulse Rate Statistics:   Pulse Mean (bpm):   67              Pulse Range:   between 55 and 90 bpm             IMPRESSION:  This HST confirms the presence  of very mild sleep apnea with an unusual distribution- the patient had more apnea sleeping on her  side ( left) than in supine.  No prolonged hypoxia which can contribute to nocturnal headaches or AM headaches.    RECOMMENDATION: the patient can give CPAP a trial or use a dental device to reduce apnea and snoring further. This is optional, due to the  mild degree of apnea.     INTERPRETING PHYSICIAN:   Melvyn Novas, MD   Cc Dr Lucia Gaskins

## 2022-12-31 NOTE — Telephone Encounter (Signed)
-----   Message from Melvyn Novas, MD sent at 12/31/2022  8:32 AM EDT ----- This HST confirms the presence of very mild sleep apnea with an unusual distribution- the patient had more apnea sleeping on her side ( left) than in supine.  No prolonged hypoxia which can contribute to nocturnal headaches or AM headaches.    RECOMMENDATION: the patient can give CPAP a trial or use a dental device to reduce apnea and snoring further. This is optional, due to the  mild degree of apnea.

## 2023-01-06 ENCOUNTER — Encounter: Payer: Self-pay | Admitting: Neurology

## 2023-01-06 NOTE — Telephone Encounter (Signed)
Called the pt for 3rd attempt. Advised the pt to either check mychart and reply there or call me back to review.

## 2023-03-19 ENCOUNTER — Encounter: Payer: Self-pay | Admitting: Neurology

## 2023-03-23 NOTE — Progress Notes (Unsigned)
PATIENT: Jennifer Howard DOB: 12/30/1965  REASON FOR VISIT: follow up HISTORY FROM: patient PRIMARY NEUROLOGIST: Dr. Lucia Gaskins   Chief Complaint  Patient presents with   Follow-up    Pt in 18 Pt here for Migraines Pt states 20 migraines in last month Pt states migraine pain starts  in left temporal and travels to  left eye Pt states stabbing pain in left eye Pt states increased nausea       HISTORY OF PRESENT ILLNESS: Today 03/23/23  Jennifer Howard is a 57 y.o. female who has been followed in this office for Chronic Migraines. Returns today for follow-up. Reports that in the last month her headaches have become more active. Reports that she is having more nausea. This concerns her because it had resolved.  Pain on the left side. No stabbing in the left eye like before. States that her "head feels weird." Has taken Relpax, it will help but afraid of rebound headaches. No changes with gait or balance. No numbness or weakness. Has floaters in her vision. Reports that the first floater in the right eye was after an accident. Right is a small grayish/black dot. Left side now has 3 little gray dots. Left eye started in August.They are there all the time not associated with when she gets a migraine. Will see opthamalogy on 10/6. Today is the first day she has not had a headache in a month. Had a sleep study that showed mild sleep apnea. Treatment with CPAP was optional. She has not pursued this. States that her waking up with a headache has decreased.   HISTORY 10/07/2022: Here for follow up. Started on emgality for chronic migraines. Doing great. Sleep study: waking with headaches, snoring, excessively tired, morning headache, obese, could fall asleep against a wall, wakes herslef up snoring.   Patient complains of symptoms per HPI as well as the following symptoms: none . Pertinent negatives and positives per HPI. All others negative     04/04/2022: Having 15 migraine days a month or more and daily  headaches. When she was on the emgality and it was really helping. She has morning headaches and we discussed sleep apnea, she has bad knees, she has gained a lot of weight, we can try emgality again and see how she does. She takes zomig and it works. We ordered emgality, filled out the copay card.      HPI 04/06/2020:  Jennifer Howard is a 57 y.o. female here as requested by No ref. provider found for migraines. Started in 1995. Started in the setting of stress. She was scared, thought she had a brain tumor, she had an EEG. She was diagnosed with migraines by a physician. The weather is a trigger. Stress is a huge trigger. Always on thee left side of the head, ice pick through the eye, nausea and vomiting, no aura, acute in onset, light and sound sensitivity, smells and light are the worst. Light can trigger a migraine. Smells can trigger or make it worse.Ice cold helps. She can have migraines every day. She snores, her sinuses are clogged. She can't breath, her head hurts, this past year has been the worst. They last all day. No other focal neurologic deficits, associated symptoms, inciting events or modifiable factors.   Tried: Cymbalta, Topamax, lamictal, went to the headache center at [facility], amitriptyline, metoprolol, reglan, , tylenol, asa, celexa, emgality, ibuprofen, toradol, zofran, ubrelvy, verapamil, zomig, sumatriptan   Patient complains of symptoms per HPI as well as the following symptoms:  migraines . Pertinent negatives and positives per HPI. All others negative  REVIEW OF SYSTEMS: Out of a complete 14 system review of symptoms, the patient complains only of the following symptoms, and all other reviewed systems are negative.  ALLERGIES: Allergies  Allergen Reactions   Prednisone Other (See Comments)    Sensitivity, migraines    Iodine Nausea And Vomiting    Nausea and vomiting, post arteriogram    Penicillins Other (See Comments)    Childhood Reaction Has patient had a PCN reaction  causing immediate rash, facial/tongue/throat swelling, SOB or lightheadedness with hypotension: Unknown Has patient had a PCN reaction causing severe rash involving mucus membranes or skin necrosis: Unknown Has patient had a PCN reaction that required hospitalization Unknown Has patient had a PCN reaction occurring within the last 10 years: No If all of the above answers are "NO", then may proceed with Cephalosporin use.     HOME MEDICATIONS: Outpatient Medications Prior to Visit  Medication Sig Dispense Refill   ARIPiprazole (ABILIFY) 2 MG tablet Take 2 mg by mouth daily.     buPROPion (WELLBUTRIN) 100 MG tablet Take 100 mg by mouth. Take 2 tablets in am     chlorpheniramine (CHLOR-TRIMETON) 4 MG tablet Take 4 mg by mouth daily as needed for allergies.     citalopram (CELEXA) 40 MG tablet Take 80 mg by mouth at bedtime.     eletriptan (RELPAX) 40 MG tablet Take 1 tablet (40 mg total) by mouth as needed for migraine or headache. May repeat in 2 hours if headache persists or recurs. 9 tablet 11   esomeprazole (NEXIUM) 20 MG capsule Take 20 mg by mouth daily as needed.     Galcanezumab-gnlm (EMGALITY) 120 MG/ML SOAJ Inject 120 mg into the skin every 30 (thirty) days. Please give 90 day suppy if possible. NEW COPAY CARD: Rxbin [id number] PCN [id number] grp [id number] ID [id number] expires 05/2023 3 mL 11   Galcanezumab-gnlm (EMGALITY) 120 MG/ML SOAJ Inject 1 Pen into the skin every 30 (thirty) days. 2 mL 0   ibuprofen (ADVIL,MOTRIN) 200 MG tablet Take 600 mg by mouth every 6 (six) hours as needed for moderate pain. Patient used this medication for pain.      lamoTRIgine (LAMICTAL) 200 MG tablet Take 400 mg by mouth daily.     lidocaine (LIDODERM) 5 % Place 1 patch onto the skin daily. Remove & Discard patch within 12 hours or as directed by MD     metoprolol tartrate (LOPRESSOR) 25 MG tablet Take one tablet as needed for heart racing.  May take an additional tablet after 30 minutes if needed. 60 tablet 11    trolamine salicylate (ASPERCREME) 10 % cream Apply 1 application topically as needed for muscle pain. Patient used this medication for foot and shoulder pain.      No facility-administered medications prior to visit.    PAST MEDICAL HISTORY: Past Medical History:  Diagnosis Date   Arthritis    "little; in my back, right foot" (04/12/2016)   Chronic lower back pain    Depression    GERD (gastroesophageal reflux disease)    HTN (hypertension)    Migraine    "twice/week" (04/12/2016)   Obesity    SVT (supraventricular tachycardia)    a. s/p RFCA on 04/12/16    PAST SURGICAL HISTORY: Past Surgical History:  Procedure Laterality Date   ELECTROPHYSIOLOGIC STUDY N/A 04/12/2016   Procedure: SVT Ablation;  Surgeon: Marinus Maw, MD;  Location: [facility] INVASIVE CV LAB;  Service: Cardiovascular;  Laterality: N/A;   HEMANGIOMA EXCISION  1972 - 1990   "right breast"   REDUCTION MAMMAPLASTY Left 1990   "to make it the same size as the right"   TOE SURGERY Right ~ 1985   "removal of tumor in toe"    FAMILY HISTORY: Family History  Problem Relation Age of Onset   Diabetes Mother    Cancer Mother        bladder   Heart attack Father    Heart disease Father    Urolithiasis Father    Colon cancer Neg Hx    Colon polyps Neg Hx    Migraines Neg Hx     SOCIAL HISTORY: Social History   Socioeconomic History   Marital status: Single    Spouse name: Not on file   Number of children: 0   Years of education: Not on file   Highest education level: Not on file  Occupational History   Occupation: Environmental health practitioner  Tobacco Use   Smoking status: Never   Smokeless tobacco: Never  Substance and Sexual Activity   Alcohol use: No   Drug use: No   Sexual activity: Never    Birth control/protection: None  Other Topics Concern   Not on file  Social History Narrative   Lives with her mother and takes care of her   Right handed   Caffeine: 1 cup/day   Social Determinants of  Health   Financial Resource Strain: High Risk (03/11/2022)   Received from [facility], [facility]   Overall Financial Resource Strain (CARDIA)    Difficulty of Paying Living Expenses: Very hard  Food Insecurity: Food Insecurity Present (03/11/2022)   Received from [facility], [facility]   Hunger Vital Sign    Worried About Running Out of Food in the Last Year: Sometimes true    Ran Out of Food in the Last Year: Never true  Transportation Needs: No Transportation Needs (03/11/2022)   Received from [facility], [facility]   PRAPARE - Transportation    Lack of Transportation (Medical): No    Lack of Transportation (Non-Medical): No  Physical Activity: Inactive (03/11/2022)   Received from [facility], [facility]   Exercise Vital Sign    Days of Exercise per Week: 0 days    Minutes of Exercise per Session: 0 min  Stress: Stress Concern Present (03/11/2022)   Received from [facility], [facility] - Occupational Stress Questionnaire    Feeling of Stress : Very much  Social Connections: Somewhat Isolated (03/11/2022)   Received from [facility], [facility]   Social Network    How would you rate your social network (family, work, friends)?: Restricted participation with some degree of social isolation  Intimate Partner Violence: Not At Risk (03/11/2022)   Received from [facility], [facility]   HITS    Over the last 12 months how often did your partner physically hurt you?: 1    Over the last 12 months how often did your partner insult you or talk down to you?: 1    Over the last 12 months how often did your partner threaten you with physical harm?: 1    Over the last 12 months how often did your partner scream or curse at you?: 1      PHYSICAL EXAM  Vitals:   03/24/23 1058  BP: (!) 142/83  Pulse: 76  Weight: 294 lb (133.4 kg)  Height: 5'  5" (1.651 m)   Body mass index is 48.92 kg/m.  Generalized:  Well developed, in no acute distress   Neurological examination  Mentation: Alert oriented to time, place, history taking. Follows all commands speech and language fluent Cranial nerve II-XII: Pupils were equal round reactive to light. Extraocular movements were full, visual field were full on confrontational test. Facial sensation and strength were normal. Head turning and shoulder shrug  were normal and symmetric. Motor: The motor testing reveals 5 over 5 strength of all 4 extremities. Good symmetric motor tone is noted throughout.  Sensory: Sensory testing is intact to soft touch on all 4 extremities. No evidence of extinction is noted.  Coordination: Cerebellar testing reveals good finger-nose-finger and heel-to-shin bilaterally.  Gait and station: Uses a cane when ambulating. Reflexes: Deep tendon reflexes are symmetric and normal bilaterally.   DIAGNOSTIC DATA (LABS, IMAGING, TESTING) - I reviewed patient records, labs, notes, testing and imaging myself where available.  Lab Results  Component Value Date   WBC 13.6 (H) 03/20/2020   HGB 14.9 03/20/2020   HCT 45.7 03/20/2020   MCV 92.1 03/20/2020   PLT 374 03/20/2020      Component Value Date/Time   NA 142 03/20/2020 1925   K 3.8 03/20/2020 1925   CL 105 03/20/2020 1925   CO2 26 03/20/2020 1925   GLUCOSE 123 (H) 03/20/2020 1925   BUN 14 03/20/2020 1925   CREATININE 1.03 (H) 03/20/2020 1925   CREATININE 0.84 03/26/2016 1102   CALCIUM 9.0 03/20/2020 1925   PROT 7.8 03/20/2020 1925   ALBUMIN 4.7 03/20/2020 1925   AST 13 (L) 03/20/2020 1925   ALT 12 03/20/2020 1925   ALKPHOS 70 03/20/2020 1925   BILITOT 0.3 03/20/2020 1925   GFRNONAA >60 03/20/2020 1925   GFRAA >60 03/20/2020 1925     ASSESSMENT AND PLAN 57 y.o. year old female  has a past medical history of Arthritis, Chronic lower back pain, Depression, GERD (gastroesophageal reflux disease), HTN (hypertension), Migraine, Obesity, and SVT (supraventricular tachycardia).  here with:  Migraines  OSA (mild)  - Continue Emgality, may consider switching to qulipta  - Continue Relpax for abortive therapy - Try Nurtec for abortive therapy. Reviewed medication with the patient and provided info on the AVS. Samples given - Advised to not take relpax and nurtec together  - MRI brain w/wo contrast  - Keep FU with ophthalmology next week- have report sent to [place] - Discussed maybe considering CPAP therapy if headaches continue to be daily -Advised if symptoms worsen or she develop new symptoms she should let us know.  Of course for any concerning symptoms she was advised to go to the emergency department. - F/U after testing     Butch Penny, MSN, NP-C 03/23/2023, 2:52 PM [facility] [address], [address] [place], [place] [address] [phone]

## 2023-03-24 ENCOUNTER — Ambulatory Visit: Payer: 59 | Admitting: Adult Health

## 2023-03-24 ENCOUNTER — Encounter: Payer: Self-pay | Admitting: Adult Health

## 2023-03-24 VITALS — BP 142/83 | HR 76 | Ht 65.0 in | Wt 294.0 lb

## 2023-03-24 DIAGNOSIS — G43711 Chronic migraine without aura, intractable, with status migrainosus: Secondary | ICD-10-CM | POA: Diagnosis not present

## 2023-03-24 DIAGNOSIS — G4733 Obstructive sleep apnea (adult) (pediatric): Secondary | ICD-10-CM

## 2023-03-24 MED ORDER — NURTEC 75 MG PO TBDP: 75.0000 mg | ORAL_TABLET | ORAL | 0 refills | Status: DC | PRN

## 2023-03-24 NOTE — Patient Instructions (Addendum)
Your Plan:  Continue Emgality  May consider switching qulipta in the future  Try Nurtec samples for abortive therapy  Ok to use relpax but do not take with nurtec MRI brain w/without contrast If your symptoms worsen or you develop new symptoms please let us know.     Thank you for coming to see [place] at [facility]. I hope we have been able to provide you high quality care today.  You may receive a patient satisfaction survey over the next few weeks. We would appreciate your feedback and comments so that we may continue to improve ourselves and the health of our patients.

## 2023-03-27 ENCOUNTER — Telehealth: Payer: Self-pay | Admitting: Adult Health

## 2023-03-27 NOTE — Telephone Encounter (Signed)
[facility] NPR case #[id number] sent to GI per patient request. [phone]

## 2023-04-14 ENCOUNTER — Encounter: Payer: Self-pay | Admitting: Adult Health

## 2023-04-14 DIAGNOSIS — G43711 Chronic migraine without aura, intractable, with status migrainosus: Secondary | ICD-10-CM

## 2023-04-14 MED ORDER — NURTEC 75 MG PO TBDP: ORAL_TABLET | ORAL | 3 refills | Status: AC

## 2023-04-29 ENCOUNTER — Other Ambulatory Visit (HOSPITAL_COMMUNITY): Payer: Self-pay

## 2023-04-29 ENCOUNTER — Telehealth: Payer: Self-pay

## 2023-04-29 NOTE — Telephone Encounter (Signed)
Pharmacy Patient Advocate Encounter   Received notification from Patient Advice Request messages that prior authorization for Nurtec 75MG  dispersible tablets is required/requested.   Insurance verification completed.   The patient is insured through [facility] [employer] .   Per test claim: PA required; PA submitted to above mentioned insurance via CoverMyMeds Key/confirmation #/EOC [id number] Status is pending

## 2023-04-30 ENCOUNTER — Other Ambulatory Visit (HOSPITAL_COMMUNITY): Payer: Self-pay

## 2023-04-30 NOTE — Telephone Encounter (Signed)
Pharmacy Patient Advocate Encounter  Received notification from [facility] that Prior Authorization for Nurtec 75MG  dispersible tablets has been APPROVED from 04/29/2023 to 04/28/2024. Ran test claim, Copay is $0. This test claim was processed through [facility]- copay amounts may vary at other pharmacies due to pharmacy/plan contracts, or as the patient moves through the different stages of their insurance plan.   PA #/Case ID/Reference #: PA Case ID #: [id number]

## 2023-05-08 ENCOUNTER — Encounter: Payer: Self-pay | Admitting: Neurology

## 2023-05-09 MED ORDER — QULIPTA 30 MG PO TABS: 30.0000 mg | ORAL_TABLET | Freq: Every day | ORAL | 11 refills | Status: AC

## 2023-05-13 ENCOUNTER — Other Ambulatory Visit (HOSPITAL_COMMUNITY): Payer: Self-pay

## 2023-05-13 ENCOUNTER — Telehealth: Payer: Self-pay | Admitting: *Deleted

## 2023-05-13 ENCOUNTER — Telehealth: Payer: Self-pay

## 2023-05-13 NOTE — Telephone Encounter (Signed)
Can we get a [place] PA urgently? It was prescribed end of last week.

## 2023-05-13 NOTE — Telephone Encounter (Signed)
Pharmacy Patient Advocate Encounter   Received notification from Physician's Office that prior authorization for Qulipta 30MG  tablets is required/requested.   Insurance verification completed.   The patient is insured through [facility] .   Per test claim: PA required; PA submitted to above mentioned insurance via CoverMyMeds Key/confirmation #/EOC [id number] Status is pending

## 2023-05-13 NOTE — Telephone Encounter (Signed)
PA request has been Submitted. New Encounter created for follow up. For additional info see Pharmacy Prior Auth telephone encounter from 05/13/2023.

## 2023-05-14 ENCOUNTER — Other Ambulatory Visit (HOSPITAL_COMMUNITY): Payer: Self-pay

## 2023-05-14 ENCOUNTER — Encounter: Payer: Self-pay | Admitting: Adult Health

## 2023-05-14 NOTE — Telephone Encounter (Signed)
Pharmacy Patient Advocate Encounter  Received notification from [facility] that Prior Authorization for Qulipta 30MG  tablets has been APPROVED from 05/13/2023 to 05/12/2024. Ran test claim, Copay is $0. This test claim was processed through [facility]- copay amounts may vary at other pharmacies due to pharmacy/plan contracts, or as the patient moves through the different stages of their insurance plan.   PA #/Case ID/Reference #: PA Case ID #: [id number]

## 2023-05-17 ENCOUNTER — Inpatient Hospital Stay
Admission: RE | Admit: 2023-05-17 | Discharge: 2023-05-17 | Discharge status: Home / Self Care | Payer: 59 | Source: Ambulatory Visit | Attending: Adult Health

## 2023-05-17 ENCOUNTER — Other Ambulatory Visit: Payer: Self-pay | Admitting: Adult Health

## 2023-05-17 DIAGNOSIS — G4733 Obstructive sleep apnea (adult) (pediatric): Secondary | ICD-10-CM

## 2023-05-17 DIAGNOSIS — G43711 Chronic migraine without aura, intractable, with status migrainosus: Secondary | ICD-10-CM

## 2023-07-01 ENCOUNTER — Encounter: Payer: Self-pay | Admitting: Adult Health

## 2023-07-01 DIAGNOSIS — Z0289 Encounter for other administrative examinations: Secondary | ICD-10-CM

## 2023-07-10 NOTE — Telephone Encounter (Signed)
FMLA form completed, signed and sent to medical records.

## 2023-07-11 ENCOUNTER — Telehealth: Payer: Self-pay | Admitting: *Deleted

## 2023-07-11 NOTE — Telephone Encounter (Signed)
Pt fmla form faxed on 07/10/2023

## 2023-09-09 NOTE — Progress Notes (Addendum)
 PATIENT: Jennifer Howard DOB: 1965-10-24  REASON FOR VISIT: follow up HISTORY FROM: patient  Virtual Visit via Video Note  I connected with Ollen Gross on 09/10/23 at 11:15 AM EDT by a video enabled telemedicine application located remotely at [facility] and verified that I am speaking with the correct person using two identifiers who was located at their own home.   I discussed the limitations of evaluation and management by telemedicine and the availability of in person appointments. The patient expressed understanding and agreed to proceed.   PATIENT: Jennifer Howard DOB: 09-28-1965  REASON FOR VISIT: follow up HISTORY FROM: patient  HISTORY OF PRESENT ILLNESS: Today 09/09/23:  Jennifer Howard is a 58 y.o. female with a history of Migraine headaches. Returns today for follow-up.  Overall she has been doing well in regards to her headaches.  Has not had a migraine in 3 weeks.  She has remained on Emgality.  She states that she did pick up Qulipta but her headaches have gotten better so she never started it.  She uses Relpax typically and the headache will resolve in 30 minutes.  She is also using Nurtec as well and has found it beneficial.  She does state that she was hospitalized several weeks ago due to suicide attempt.  She states that she is doing better.  She does have a psychiatrist and a counselor but they are typically booked out.  She is trying to get established with someone closer to [place] which is where she lives.  She confirms that she has no thoughts of suicide today.  She does state that during the hospitalization they did an MRI of the brain that showed an old lacunar infarct.  Our office completed an MRI of the brain in November and that was not reported on that impression.  I have advised the patient that she can bring the CD by our office and I will have one of our neuroradiologist compare the images.    MRI brain: IMPRESSION: This MRI of the brain  without contrast shows the following: 1.  Scattered T2/FLAIR hyperintense foci in the subcortical and deep white matter of the cerebral hemispheres consistent with mild chronic microvascular ischemic change, more than typical for age. 2.   Mild generalized cortical atrophy that is likely normal for age. 3.   No acute findings. 4.   Chronic ethmoid and maxillary sinusitis    HISTORY 03/23/23   Jennifer Howard is a 58 y.o. female who has been followed in this office for Chronic Migraines. Returns today for follow-up. Reports that in the last month her headaches have become more active. Reports that she is having more nausea. This concerns her because it had resolved.  Pain on the left side. No stabbing in the left eye like before. States that her "head feels weird." Has taken Relpax, it will help but afraid of rebound headaches. No changes with gait or balance. No numbness or weakness. Has floaters in her vision. Reports that the first floater in the right eye was after an accident. Right is a small grayish/black dot. Left side now has 3 little gray dots. Left eye started in August.They are there all the time not associated with when she gets a migraine. Will see opthamalogy on 10/6. Today is the first day she has not had a headache in a month. Had a sleep study that showed mild sleep apnea. Treatment with CPAP was optional. She has not pursued this. States that her waking  up with a headache has decreased.     HISTORY 10/07/2022: Here for follow up. Started on emgality for chronic migraines. Doing great. Sleep study: waking with headaches, snoring, excessively tired, morning headache, obese, could fall asleep against a wall, wakes herslef up snoring.   Patient complains of symptoms per HPI as well as the following symptoms: none . Pertinent negatives and positives per HPI. All others negative     04/04/2022: Having 15 migraine days a month or more and daily headaches. When she was on the emgality and it  was really helping. She has morning headaches and we discussed sleep apnea, she has bad knees, she has gained a lot of weight, we can try emgality again and see how she does. She takes zomig and it works. We ordered emgality, filled out the copay card.      HPI 04/06/2020:  Jennifer Howard is a 58 y.o. female here as requested by No ref. provider found for migraines. Started in 1995. Started in the setting of stress. She was scared, thought she had a brain tumor, she had an EEG. She was diagnosed with migraines by a physician. The weather is a trigger. Stress is a huge trigger. Always on thee left side of the head, ice pick through the eye, nausea and vomiting, no aura, acute in onset, light and sound sensitivity, smells and light are the worst. Light can trigger a migraine. Smells can trigger or make it worse.Ice cold helps. She can have migraines every day. She snores, her sinuses are clogged. She can't breath, her head hurts, this past year has been the worst. They last all day. No other focal neurologic deficits, associated symptoms, inciting events or modifiable factors.   Tried: Cymbalta, Topamax, lamictal, went to the headache center at [facility], amitriptyline, metoprolol, reglan, , tylenol, asa, celexa, emgality, ibuprofen, toradol, zofran, ubrelvy, verapamil, zomig, sumatriptan   Patient complains of symptoms per HPI as well as the following symptoms: migraines . Pertinent negatives and positives per HPI. All others negative  REVIEW OF SYSTEMS: Out of a complete 14 system review of symptoms, the patient complains only of the following symptoms, and all other reviewed systems are negative.  ALLERGIES: Allergies  Allergen Reactions   Prednisone Other (See Comments)    Sensitivity, migraines    Iodine Nausea And Vomiting    Nausea and vomiting, post arteriogram    Penicillins Other (See Comments)    Childhood Reaction Has patient had a PCN reaction causing immediate rash, facial/tongue/throat  swelling, SOB or lightheadedness with hypotension: Unknown Has patient had a PCN reaction causing severe rash involving mucus membranes or skin necrosis: Unknown Has patient had a PCN reaction that required hospitalization Unknown Has patient had a PCN reaction occurring within the last 10 years: No If all of the above answers are "NO", then may proceed with Cephalosporin use.     HOME MEDICATIONS: Outpatient Medications Prior to Visit  Medication Sig Dispense Refill   ARIPiprazole (ABILIFY) 2 MG tablet Take 2 mg by mouth daily. (Patient not taking: Reported on 03/24/2023)     Atogepant (QULIPTA) 30 MG TABS Take 1 tablet (30 mg total) by mouth daily. 30 tablet 11   buPROPion (WELLBUTRIN) 100 MG tablet Take 100 mg by mouth. Take 2 tablets in am     chlorpheniramine (CHLOR-TRIMETON) 4 MG tablet Take 4 mg by mouth daily as needed for allergies.     citalopram (CELEXA) 40 MG tablet Take 80 mg by mouth at bedtime.  eletriptan (RELPAX) 40 MG tablet Take 1 tablet (40 mg total) by mouth as needed for migraine or headache. May repeat in 2 hours if headache persists or recurs. 9 tablet 11   esomeprazole (NEXIUM) 20 MG capsule Take 20 mg by mouth daily as needed.     Galcanezumab-gnlm (EMGALITY) 120 MG/ML SOAJ Inject 120 mg into the skin every 30 (thirty) days. Please give 90 day suppy if possible. NEW COPAY CARD: Rxbin [id number] PCN PDMI grp [id number] ID [id number] expires 05/2023 3 mL 11   Galcanezumab-gnlm (EMGALITY) 120 MG/ML SOAJ Inject 1 Pen into the skin every 30 (thirty) days. 2 mL 0   ibuprofen (ADVIL,MOTRIN) 200 MG tablet Take 600 mg by mouth every 6 (six) hours as needed for moderate pain. Patient used this medication for pain.      lamoTRIgine (LAMICTAL) 200 MG tablet Take 400 mg by mouth daily.     lidocaine (LIDODERM) 5 % Place 1 patch onto the skin daily. Remove & Discard patch within 12 hours or as directed by MD     metoprolol tartrate (LOPRESSOR) 25 MG tablet Take one tablet as needed  for heart racing.  May take an additional tablet after 30 minutes if needed. 60 tablet 11   Rimegepant Sulfate (NURTEC) 75 MG TBDP Take 75 mg by mouth if needed at onset of migraine. Do not exceed 75 mg in 24 hours. 8 tablet 3   trolamine salicylate (ASPERCREME) 10 % cream Apply 1 application topically as needed for muscle pain. Patient used this medication for foot and shoulder pain.      No facility-administered medications prior to visit.    PAST MEDICAL HISTORY: Past Medical History:  Diagnosis Date   Arthritis    "little; in my back, right foot" (04/12/2016)   Chronic lower back pain    Depression    GERD (gastroesophageal reflux disease)    HTN (hypertension)    Migraine    "twice/week" (04/12/2016)   Obesity    SVT (supraventricular tachycardia)    a. s/p RFCA on 04/12/16    PAST SURGICAL HISTORY: Past Surgical History:  Procedure Laterality Date   ELECTROPHYSIOLOGIC STUDY N/A 04/12/2016   Procedure: SVT Ablation;  Surgeon: Marinus Maw, MD;  Location: [facility] INVASIVE CV [facility];  Service: Cardiovascular;  Laterality: N/A;   HEMANGIOMA EXCISION  1972 - 1990   "right breast"   REDUCTION MAMMAPLASTY Left 1990   "to make it the same size as the right"   TOE SURGERY Right ~ 1985   "removal of tumor in toe"    FAMILY HISTORY: Family History  Problem Relation Age of Onset   Diabetes Mother    Cancer Mother        bladder   Heart attack Father    Heart disease Father    Urolithiasis Father    Colon cancer Neg Hx    Colon polyps Neg Hx    Migraines Neg Hx     SOCIAL HISTORY: Social History   Socioeconomic History   Marital status: Single    Spouse name: Not on file   Number of children: 0   Years of education: Not on file   Highest education level: Not on file  Occupational History   Occupation: Environmental health practitioner  Tobacco Use   Smoking status: Never   Smokeless tobacco: Never  Substance and Sexual Activity   Alcohol use: No   Drug use: No   Sexual  activity: Never    Birth control/protection: None  Other  Topics Concern   Not on file  Social History Narrative   Lives with her mother and takes care of her   Right handed   Caffeine: 1 cup/day   Social Drivers of Corporate investment banker Strain: High Risk (03/11/2022)   Received from [facility]   Overall Financial Resource Strain (CARDIA)    Difficulty of Paying Living Expenses: Very hard  Food Insecurity: No Food Insecurity (08/26/2023)   Received from [facility]   Hunger Vital Sign    Worried About Running Out of Food in the Last Year: Never true    Ran Out of Food in the Last Year: Never true  Transportation Needs: No Transportation Needs (08/29/2023)   Received from [facility] - Transportation    Lack of Transportation (Medical): No    Lack of Transportation (Non-Medical): No  Physical Activity: Inactive (03/11/2022)   Received from [facility], [facility]   Exercise Vital Sign    Days of Exercise per Week: 0 days    Minutes of Exercise per Session: 0 min  Stress: Stress Concern Present (08/26/2023)   Received from [facility] - Occupational Stress Questionnaire    Feeling of Stress : Very much  Social Connections: Unknown (04/06/2023)   Received from [facility]   Social Network    Social Network: Not on file  Intimate Partner Violence: Not At Risk (08/26/2023)   Received from [facility]   HITS    Over the last 12 months how often did your partner physically hurt you?: Never    Over the last 12 months how often did your partner insult you or talk down to you?: Never    Over the last 12 months how often did your partner threaten you with physical harm?: Never    Over the last 12 months how often did your partner scream or curse at you?: Never      PHYSICAL EXAM Generalized: Well developed, in no acute distress   Neurological examination  Mentation: Alert oriented to time,  place, history taking.  speech and language fluent Cranial nerve II-XII:Facial symmetry noted.   DIAGNOSTIC DATA (LABS, IMAGING, TESTING) - I reviewed patient records, labs, notes, testing and imaging myself where available.  Lab Results  Component Value Date   WBC 13.6 (H) 03/20/2020   HGB 14.9 03/20/2020   HCT 45.7 03/20/2020   MCV 92.1 03/20/2020   PLT 374 03/20/2020      Component Value Date/Time   NA 142 03/20/2020 1925   K 3.8 03/20/2020 1925   CL 105 03/20/2020 1925   CO2 26 03/20/2020 1925   GLUCOSE 123 (H) 03/20/2020 1925   BUN 14 03/20/2020 1925   CREATININE 1.03 (H) 03/20/2020 1925   CREATININE 0.84 03/26/2016 1102   CALCIUM 9.0 03/20/2020 1925   PROT 7.8 03/20/2020 1925   ALBUMIN 4.7 03/20/2020 1925   AST 13 (L) 03/20/2020 1925   ALT 12 03/20/2020 1925   ALKPHOS 70 03/20/2020 1925   BILITOT 0.3 03/20/2020 1925   GFRNONAA >60 03/20/2020 1925   GFRAA >60 03/20/2020 1925      ASSESSMENT AND PLAN 58 y.o. year old female  has a past medical history of Arthritis, Chronic lower back pain, Depression, GERD (gastroesophageal reflux disease), HTN (hypertension), Migraine, Obesity, and SVT (supraventricular tachycardia). here with:  Migraines  Depression with recent suicide attempt   - Continue Emgality - Continue Relpax for abortive therapy -Continue  Nurtec for abortive therapy. -Did advise that if she brings her most recent MRI imaging to [place] I will have our neuroradiologist compare her scans. -Patient is trying to establish with a psychiatrist and counselor closer to [facility]. -I did advise that if she has any thoughts of suicide she should seek care at the nearest ED/urgent care.  Or call 911.   -She did have a sleep study in the past that showed mild sleep apnea.  She has opted not to treat this as of now. -Advised if symptoms worsen or she develop new symptoms she should let us know.   -Follow-up in 6 months or sooner if needed     Butch Penny, MSN,  NP-C 09/09/2023, 6:10 PM [facility] [address], [address] [place], [place] [address] [phone]

## 2023-09-10 ENCOUNTER — Telehealth: Payer: 59 | Admitting: Adult Health

## 2023-09-10 DIAGNOSIS — F339 Major depressive disorder, recurrent, unspecified: Secondary | ICD-10-CM

## 2023-09-10 DIAGNOSIS — Z9151 Personal history of suicidal behavior: Secondary | ICD-10-CM | POA: Diagnosis not present

## 2023-09-10 DIAGNOSIS — F32A Depression, unspecified: Secondary | ICD-10-CM

## 2023-09-10 DIAGNOSIS — G43711 Chronic migraine without aura, intractable, with status migrainosus: Secondary | ICD-10-CM | POA: Diagnosis not present

## 2023-09-10 NOTE — Patient Instructions (Addendum)
-   Continue Emgality - Continue Relpax for abortive therapy -Continue Nurtec for abortive therapy. -If you can get CD of imaging from your recent MRI I will be happy to have our neuroradiologist review -If you have any thoughts of harming yourself please go to the nearest ED or urgent care or call 911

## 2023-10-07 ENCOUNTER — Other Ambulatory Visit: Payer: Self-pay | Admitting: Neurology

## 2023-10-07 DIAGNOSIS — G43709 Chronic migraine without aura, not intractable, without status migrainosus: Secondary | ICD-10-CM

## 2023-11-24 ENCOUNTER — Other Ambulatory Visit (HOSPITAL_COMMUNITY): Payer: Self-pay

## 2023-11-24 ENCOUNTER — Telehealth: Payer: Self-pay

## 2023-11-24 NOTE — Telephone Encounter (Signed)
 Pharmacy Patient Advocate Encounter   Received notification from Fax that prior authorization for Emgality  120MG /ML auto-injectors (migraine) is required/requested.   Insurance verification completed.   The patient is insured through [facility] .   Per test claim: PA required; PA submitted to above mentioned insurance via CoverMyMeds Key/confirmation #/EOC [id number] Status is pending

## 2023-11-25 ENCOUNTER — Other Ambulatory Visit (HOSPITAL_COMMUNITY): Payer: Self-pay

## 2023-11-25 NOTE — Telephone Encounter (Signed)
 Pharmacy Patient Advocate Encounter  Received notification from [facility] that Prior Authorization for Emgality  120MG /ML auto-injectors (migraine) has been APPROVED from 11/24/2023 to 11/23/2024. Ran test claim, Copay is $0. This test claim was processed through [facility]- copay amounts may vary at other pharmacies due to pharmacy/plan contracts, or as the patient moves through the different stages of their insurance plan.   PA #/Case ID/Reference #: [id number]

## 2023-12-17 ENCOUNTER — Other Ambulatory Visit: Payer: Self-pay | Admitting: Neurology

## 2024-01-02 ENCOUNTER — Encounter: Payer: Self-pay | Admitting: Adult Health

## 2024-01-05 ENCOUNTER — Other Ambulatory Visit (HOSPITAL_COMMUNITY): Payer: Self-pay

## 2024-01-05 ENCOUNTER — Telehealth: Payer: Self-pay

## 2024-01-05 MED ORDER — EMGALITY 120 MG/ML ~~LOC~~ SOAJ: 1.0000 | SUBCUTANEOUS | 0 refills | Status: AC

## 2024-01-05 NOTE — Telephone Encounter (Signed)
 PAP: Patient assistance application for Emgality  through [employer] has been mailed to pt's home address on file. Provider portion of application will be faxed to provider's office.   I have faxed the provider portion of the application to [facility]

## 2024-01-05 NOTE — Telephone Encounter (Signed)
 Emgality  order placed for 1 sample box (2 pens). Sample ready for pickup.

## 2024-01-20 NOTE — Telephone Encounter (Signed)
 Left HIPAA compliant voice mail for patient regarding patient assistance application mailed to her home on 7/14

## 2024-01-22 NOTE — Telephone Encounter (Signed)
 Received provider portion of patient assistance application

## 2024-01-26 ENCOUNTER — Other Ambulatory Visit: Payer: Self-pay | Admitting: Medical Genetics

## 2024-01-29 NOTE — Telephone Encounter (Signed)
 Left voice mail for patient regarding patient assistance application mailed 7/14-2nd attempt

## 2024-02-05 NOTE — Telephone Encounter (Signed)
 Unable to reach pt/pt. Never returned patient assistance application

## 2024-02-18 ENCOUNTER — Telehealth: Payer: Self-pay | Admitting: Adult Health

## 2024-02-18 NOTE — Telephone Encounter (Signed)
 Pt called stating that she has lost her job and is needing to bring some forms to the office to be signed and she was wondering if she can be given a Emgality  Sample. Please advise.

## 2024-02-19 NOTE — Telephone Encounter (Signed)
 PAP: Application for Emgality  has been submitted to [employer], via fax

## 2024-02-19 NOTE — Telephone Encounter (Addendum)
 Faxed to [employer] 7 pages.  [employer] Emagality pt part.  [phone].  Fax confirmation received.

## 2024-02-20 NOTE — Progress Notes (Signed)
 Pharmacy Medication Assistance Program Note    02/20/2024  Patient ID: Jennifer Howard, female   DOB: September 09, 1965, 58 y.o.   MRN: [id number]     01/05/2024  Outreach Medication One  Initial Outreach Date (Medication One) 01/05/2024  Manufacturer Medication One [facility] Other [employer]  Type of Assistance Manufacturer Assistance  Date Application Sent to Patient 01/05/2024  Application Items Requested Application  Date Application Sent to Prescriber 01/05/2024  Name of Prescriber Duwaine Russell  Date Application Received From Patient 02/19/2024  Date Application Received From Provider 01/22/2024  Date Application Submitted to Manufacturer 02/19/2024  Method Application Sent to Manufacturer Fax  Patient Assistance Determination Approved  Approval Start Date 02/20/2024  Approval End Date 02/19/2025  Patient Notification Method MyChart     Signature

## 2024-02-20 NOTE — Telephone Encounter (Signed)
 PAP: Patient assistance application for emgality  has been approved by PAP Companies: [employer] from 02/20/2024 to 02/19/2025. Medication should be delivered to PAP Delivery: Home. For further shipping updates, please contact [employer] at [phone]. Patient ID is: [id number]

## 2024-02-24 NOTE — Telephone Encounter (Signed)
 Noted

## 2024-03-08 ENCOUNTER — Telehealth: Payer: Self-pay | Admitting: Adult Health

## 2024-03-08 NOTE — Telephone Encounter (Signed)
 Request to cx due to no insurance

## 2024-03-11 ENCOUNTER — Telehealth: Payer: Self-pay | Admitting: Adult Health

## 2024-04-06 ENCOUNTER — Other Ambulatory Visit (HOSPITAL_COMMUNITY): Payer: Self-pay

## 2024-04-26 ENCOUNTER — Other Ambulatory Visit: Payer: Self-pay | Admitting: Medical Genetics

## 2024-04-26 DIAGNOSIS — Z006 Encounter for examination for normal comparison and control in clinical research program: Secondary | ICD-10-CM
